# Patient Record
Sex: Male | Born: 1940 | Hispanic: No | Marital: Married | Smoking: Former smoker
Health system: Southern US, Community
[De-identification: ages and names within clinical notes are randomized; demographics above are authoritative.]

## PROBLEM LIST (undated history)

## (undated) DIAGNOSIS — E119 Type 2 diabetes mellitus without complications: Secondary | ICD-10-CM

## (undated) DIAGNOSIS — F0390 Unspecified dementia without behavioral disturbance: Secondary | ICD-10-CM

## (undated) DIAGNOSIS — H9190 Unspecified hearing loss, unspecified ear: Secondary | ICD-10-CM

## (undated) DIAGNOSIS — I10 Essential (primary) hypertension: Secondary | ICD-10-CM

## (undated) HISTORY — PX: ABDOMINAL SURGERY: SHX537

## (undated) HISTORY — DX: Unspecified hearing loss, unspecified ear: H91.90

---

## 2017-07-31 ENCOUNTER — Other Ambulatory Visit: Payer: Self-pay

## 2017-07-31 ENCOUNTER — Encounter (HOSPITAL_COMMUNITY): Payer: Self-pay | Admitting: *Deleted

## 2017-07-31 ENCOUNTER — Emergency Department (HOSPITAL_COMMUNITY)
Admission: EM | Admit: 2017-07-31 | Discharge: 2017-07-31 | Disposition: A | Discharge status: Home / Self Care | Payer: Medicare Other | Attending: Emergency Medicine | Admitting: Emergency Medicine

## 2017-07-31 DIAGNOSIS — R358 Other polyuria: Secondary | ICD-10-CM

## 2017-07-31 DIAGNOSIS — Z760 Encounter for issue of repeat prescription: Secondary | ICD-10-CM

## 2017-07-31 DIAGNOSIS — R631 Polydipsia: Secondary | ICD-10-CM | POA: Insufficient documentation

## 2017-07-31 DIAGNOSIS — E119 Type 2 diabetes mellitus without complications: Secondary | ICD-10-CM | POA: Insufficient documentation

## 2017-07-31 DIAGNOSIS — R3589 Other polyuria: Secondary | ICD-10-CM

## 2017-07-31 DIAGNOSIS — I10 Essential (primary) hypertension: Secondary | ICD-10-CM | POA: Diagnosis not present

## 2017-07-31 HISTORY — DX: Type 2 diabetes mellitus without complications: E11.9

## 2017-07-31 HISTORY — DX: Essential (primary) hypertension: I10

## 2017-07-31 LAB — CBC
HEMATOCRIT: 42.4 % (ref 39.0–52.0)
HEMOGLOBIN: 14.6 g/dL (ref 13.0–17.0)
MCH: 32.3 pg (ref 26.0–34.0)
MCHC: 34.4 g/dL (ref 30.0–36.0)
MCV: 93.8 fL (ref 78.0–100.0)
Platelets: 170 10*3/uL (ref 150–400)
RBC: 4.52 MIL/uL (ref 4.22–5.81)
RDW: 12.3 % (ref 11.5–15.5)
WBC: 7.5 10*3/uL (ref 4.0–10.5)

## 2017-07-31 LAB — URINALYSIS, ROUTINE W REFLEX MICROSCOPIC
Bilirubin Urine: NEGATIVE
HGB URINE DIPSTICK: NEGATIVE
Ketones, ur: NEGATIVE mg/dL
LEUKOCYTES UA: NEGATIVE
NITRITE: NEGATIVE
PH: 5 (ref 5.0–8.0)
Protein, ur: 30 mg/dL — AB
Specific Gravity, Urine: 1.03 (ref 1.005–1.030)

## 2017-07-31 LAB — BASIC METABOLIC PANEL
ANION GAP: 10 (ref 5–15)
BUN: 18 mg/dL (ref 6–20)
CALCIUM: 9.2 mg/dL (ref 8.9–10.3)
CO2: 24 mmol/L (ref 22–32)
Chloride: 102 mmol/L (ref 101–111)
Creatinine, Ser: 1.26 mg/dL — ABNORMAL HIGH (ref 0.61–1.24)
GFR calc Af Amer: >60 mL/min (ref >60)
GFR, EST NON AFRICAN AMERICAN: 53 mL/min — AB (ref >60)
GLUCOSE: 388 mg/dL — AB (ref 65–99)
POTASSIUM: 4.6 mmol/L (ref 3.5–5.1)
SODIUM: 136 mmol/L (ref 135–145)

## 2017-07-31 LAB — CBG MONITORING, ED: Glucose-Capillary: 344 mg/dL — ABNORMAL HIGH (ref 65–99)

## 2017-07-31 MED ORDER — AMLODIPINE BESYLATE 5 MG PO TABS
10.0000 mg | ORAL_TABLET | Freq: Once | ORAL | Status: AC
  Administered 2017-07-31: 10 mg via ORAL
  Filled 2017-07-31: qty 2

## 2017-07-31 MED ORDER — SODIUM CHLORIDE 0.9 % IV BOLUS (SEPSIS)
1000.0000 mL | Freq: Once | INTRAVENOUS | Status: AC
  Administered 2017-07-31: 1000 mL via INTRAVENOUS

## 2017-07-31 MED ORDER — METFORMIN HCL ER 500 MG PO TB24: 1000.0000 mg | ORAL_TABLET | Freq: Every day | ORAL | 1 refills | Status: DC

## 2017-07-31 MED ORDER — IRBESARTAN 300 MG PO TABS
300.0000 mg | ORAL_TABLET | Freq: Every day | ORAL | Status: DC
  Filled 2017-07-31: qty 1

## 2017-07-31 MED ORDER — AMLODIPINE-OLMESARTAN 10-40 MG PO TABS: 1.0000 | ORAL_TABLET | Freq: Every day | ORAL | 1 refills | Status: DC

## 2017-07-31 MED ORDER — METFORMIN HCL ER 500 MG PO TB24: 500.0000 mg | ORAL_TABLET | Freq: Two times a day (BID) | ORAL | 1 refills | Status: DC

## 2017-07-31 NOTE — ED Notes (Signed)
Pt updated on plan of care, pt encouraged to remain for evaluation

## 2017-07-31 NOTE — Discharge Instructions (Addendum)
Call the number on your discharge paperwork to get established with a primary care provider.  I have provided you with a one-month refill of your Azor and metformin.  Take 2 tablets of metformin daily at bedtime.  Take 1 tablet of Azor daily.  If you develop new or worsening symptoms, including a headache, chest pain, difficulty urinating, abdominal pain, or persistent vomiting, please return to the emergency department for re-evaluation.

## 2017-07-31 NOTE — ED Notes (Signed)
Pt aware of hypertension and advised to continue blood pressure meds and to follow up with a PCP.

## 2017-07-31 NOTE — ED Triage Notes (Signed)
PT states he has not been taking his medications for 2-3 months because he is totally out.  Pt is diabetic. Pt states he has had some uneasiness with walking for about one month. Last three weeks patient has noticed increased thirst and urination. Pt states when he fell he hurts self on left lateral abdominal area.  No LOC or syncope.

## 2017-07-31 NOTE — ED Provider Notes (Signed)
[facility] EMERGENCY DEPARTMENT Provider Note   CSN: [id number] Arrival date & time: 07/31/17  1022     History   Chief Complaint Chief Complaint  Patient presents with  . Fall    HPI Dennis Allen is a 77 y.o. male with a history of Dm Type II and HTN who presents to the emergency department with a chief complaint of polyuria and polydipsia that has been worsening over the last month.  He reports that he has been out of his home Metformin and Azor for 2-3 months.  He also reports that he has been eating a lot of candy and drinking a lot of soda for the last few weeks.  He denies fever, chills, abdominal pain, N/V/D, chest pain, dyspnea, headache, visual changes, numbness, or decreased urine.  He also endorses a mechanical fall about 3-4 days ago when he tripped while walking on the sidewalk.  He denies hitting his head, LOC, nausea, or emesis.  He reports no pain or ongoing symptoms related to the fall.  He was previously getting his medications refilled at the [facility] on [address] in [place].  His PCP, Dr. Loel Ro, is located at [facility] family [facility], and he has not seen her in "months". He also reports that he was taking Welchol previously too.   The history is provided by the patient. No language interpreter was used.    Past Medical History:  Diagnosis Date  . Diabetes mellitus without complication ([place])   . Hypertension     There are no active problems to display for this patient.   Past Surgical History:  Procedure Laterality Date  . ABDOMINAL SURGERY         Home Medications    Prior to Admission medications   Medication Sig Start Date End Date Taking? Authorizing Provider  amLODipine-olmesartan (AZOR) 10-40 MG tablet Take 1 tablet by mouth daily. 07/31/17 09/29/17  McDonald, Mia A, PA-C  metFORMIN (GLUCOPHAGE-XR) 500 MG 24 hr tablet Take 2 tablets (1,000 mg total) by mouth at bedtime. 07/31/17 09/29/17  McDonald, Maree Erie A, PA-C    Family  History No family history on file.  Social History Social History   Tobacco Use  . Smoking status: Former Research scientist (life sciences)  . Smokeless tobacco: Never Used  Substance Use Topics  . Alcohol use: Yes    Comment: occ  . Drug use: No     Allergies   Patient has no known allergies.   Review of Systems Review of Systems  Constitutional: Negative for appetite change and fever.  Eyes: Negative for visual disturbance.  Respiratory: Negative for shortness of breath.   Cardiovascular: Negative for chest pain.  Gastrointestinal: Negative for abdominal pain, diarrhea, nausea and vomiting.  Endocrine: Positive for polydipsia and polyuria.  Genitourinary: Negative for dysuria.  Musculoskeletal: Negative for back pain.  Skin: Negative for rash.  Allergic/Immunologic: Negative for immunocompromised state.  Neurological: Negative for numbness and headaches.  Psychiatric/Behavioral: Negative for confusion.     Physical Exam Updated Vital Signs BP (!) 186/106 (BP Location: Left Arm)   Pulse 63   Temp 98.9 F (37.2 C) (Oral)   Resp 18   SpO2 100%   Physical Exam  Constitutional: He appears well-developed.  HENT:  Head: Normocephalic.  Eyes: Conjunctivae are normal. No scleral icterus.  Neck: Neck supple.  Cardiovascular: Normal rate, regular rhythm and intact distal pulses. Exam reveals no gallop and no friction rub.  Murmur heard. Pulmonary/Chest: Effort normal. No stridor. No respiratory distress. He  has no wheezes. He has no rales. He exhibits no tenderness.  Abdominal: Soft. He exhibits no distension and no mass. There is no tenderness. There is no guarding. No hernia.  Neurological: He is alert.  Skin: Skin is warm and dry. Capillary refill takes less than 2 seconds.  Psychiatric: His behavior is normal.  Nursing note and vitals reviewed.    ED Treatments / Results  Labs (all labs ordered are listed, but only abnormal results are displayed) Labs Reviewed  BASIC METABOLIC  PANEL - Abnormal; Notable for the following components:      Result Value   Glucose, Bld 388 (*)    Creatinine, Ser 1.26 (*)    GFR calc non Af Amer 53 (*)    All other components within normal limits  URINALYSIS, ROUTINE W REFLEX MICROSCOPIC - Abnormal; Notable for the following components:   Glucose, UA >=500 (*)    Protein, ur 30 (*)    Bacteria, UA RARE (*)    Squamous Epithelial / LPF 0-5 (*)    All other components within normal limits  CBG MONITORING, ED - Abnormal; Notable for the following components:   Glucose-Capillary 344 (*)    All other components within normal limits  CBC    EKG  EKG Interpretation None       Radiology No results found.  Procedures Procedures (including critical care time)  Medications Ordered in ED Medications  sodium chloride 0.9 % bolus 1,000 mL (0 mLs Intravenous Stopped 07/31/17 1830)  amLODipine (NORVASC) tablet 10 mg (10 mg Oral Given 07/31/17 1756)     Initial Impression / Assessment and Plan / ED Course  I have reviewed the triage vital signs and the nursing notes.  Pertinent labs & imaging results that were available during my care of the patient were reviewed by me and considered in my medical decision making (see chart for details).     77 year old male with a history of Dm Type II and HTN resenting with worsening polydipsia and polyuria for the last month.  The patient was seen and evaluated with Dr. Gilford Raid, attending physician.  He has been out of his home Azor, metformin, and Welchol for the last 2-3 months.  Pharmacy tech called his pharmacy in [place] and was able to find his home medications were Metformin 500 mg BID and amlodipine olmesartan (Azor) 10-40mg . No prescription of Welchol on file.    BP 194/109; vital signs are otherwise unremarkable.  CBG 88. Cr 1.26.  UA with >500 glucosuria and mild proteinuria.  IV fluid bolus given in the ED.  He is or is not available in the ED so he has been given 1 dose of amlodipine  and irbesartan.  On patient recheck, the patient remains asymptomatic.  We will discharge the patient home with a one-month prescription for his home medications with 1 refill and instructions on finding a local PCP.  Strict return precautions given.  He is in no acute distress.  BP with improvement to 186/106 after amlodipine; he remains asymptomatic. The patient is safe for discharge at this time.  Final Clinical Impressions(s) / ED Diagnoses   Final diagnoses:  Polydipsia  Polyuria  Medication refill    ED Discharge Orders        Ordered    amLODipine-olmesartan (AZOR) 10-40 MG tablet  Daily,   Status:  Discontinued     07/31/17 1757    metFORMIN (GLUCOPHAGE-XR) 500 MG 24 hr tablet  2 times daily,   Status:  Discontinued     07/31/17 1757    amLODipine-olmesartan (AZOR) 10-40 MG tablet  Daily     07/31/17 1830    metFORMIN (GLUCOPHAGE-XR) 500 MG 24 hr tablet  Daily at bedtime     07/31/17 1830       McDonald, Laymond Purser, PA-C 07/31/17 Ladene Artist, MD 07/31/17 [phone]

## 2017-09-30 ENCOUNTER — Encounter: Payer: Self-pay | Admitting: Gastroenterology

## 2017-11-12 ENCOUNTER — Ambulatory Visit (INDEPENDENT_AMBULATORY_CARE_PROVIDER_SITE_OTHER): Payer: Medicare Other | Admitting: Gastroenterology

## 2017-11-12 ENCOUNTER — Encounter: Payer: Self-pay | Admitting: Gastroenterology

## 2017-11-12 VITALS — BP 130/80 | HR 86 | Ht 67.0 in | Wt 151.0 lb

## 2017-11-12 DIAGNOSIS — Z1211 Encounter for screening for malignant neoplasm of colon: Secondary | ICD-10-CM | POA: Diagnosis not present

## 2017-11-12 DIAGNOSIS — R54 Age-related physical debility: Secondary | ICD-10-CM | POA: Diagnosis not present

## 2017-11-12 MED ORDER — AMLODIPINE-OLMESARTAN 10-40 MG PO TABS: 1.0000 | ORAL_TABLET | Freq: Every day | ORAL | 0 refills | Status: DC

## 2017-11-12 MED ORDER — SUPREP BOWEL PREP KIT 17.5-3.13-1.6 GM/177ML PO SOLN: ORAL | 0 refills | Status: DC

## 2017-11-12 MED ORDER — METFORMIN HCL ER 500 MG PO TB24: 1000.0000 mg | ORAL_TABLET | Freq: Every day | ORAL | 0 refills | Status: DC

## 2017-11-12 NOTE — Progress Notes (Signed)
HPI :  77 year old male with history of diabetes, hypertension, hearing loss, here for new patient consultation to discuss colon cancer screening.  He states he has live in [place] for several years and moved to [place] in January. He was told by his primary care physician that he needed a colonoscopy. He states his last colonoscopy was about 20 years ago and thinks it was normal. We don't have any records of that exam available today. He denies any troubles with his bowels, no diarrhea or constipation. No blood in his stools. No abdominal pains. He is eating well, no dysphagia, no reflux, no nausea, no vomiting. Overall he is feeling pretty well, he denies any cardiac/pulmonary symptoms. He denies any family history of colon cancer. He is asking for refills of his Azor and metformin today as he is running out of both of them.  He states he is otherwise in good health.     Past Medical History:  Diagnosis Date  . Diabetes mellitus without complication ([place])   . Hard of hearing   . Hypertension      Past Surgical History:  Procedure Laterality Date  . ABDOMINAL SURGERY     Family History  Family history unknown: Yes   Social History   Tobacco Use  . Smoking status: Former Research scientist (life sciences)  . Smokeless tobacco: Never Used  Substance Use Topics  . Alcohol use: Yes    Comment: occ  . Drug use: No   Current Outpatient Medications  Medication Sig Dispense Refill  . amLODipine-olmesartan (AZOR) 10-40 MG tablet Take 1 tablet by mouth daily. 30 tablet 1  . metFORMIN (GLUCOPHAGE-XR) 500 MG 24 hr tablet Take 2 tablets (1,000 mg total) by mouth at bedtime. 60 tablet 1   No current facility-administered medications for this visit.    No Known Allergies   Review of Systems: All systems reviewed and negative except where noted in HPI.   Lab Results  Component Value Date   WBC 7.5 07/31/2017   HGB 14.6 07/31/2017   HCT 42.4 07/31/2017   MCV 93.8 07/31/2017   PLT 170 07/31/2017     Lab Results  Component Value Date   CREATININE 1.26 (H) 07/31/2017   BUN 18 07/31/2017   NA 136 07/31/2017   K 4.6 07/31/2017   CL 102 07/31/2017   CO2 24 07/31/2017    No results found for: ALT, AST, GGT, ALKPHOS, BILITOT   Physical Exam: BP 130/80   Pulse 86   Ht 5\' 7"  (1.702 m)   Wt 151 lb (68.5 kg)   SpO2 98%   BMI 23.65 kg/m  Constitutional: Pleasant,well-developed, male in no acute distress, walks with cane HEENT: Normocephalic and atraumatic. Conjunctivae are normal. No scleral icterus. Neck supple.  Cardiovascular: Normal rate, regular rhythm.  Pulmonary/chest: Effort normal and breath sounds normal. No wheezing, rales or rhonchi. Abdominal: Soft, nondistended, nontender. There are no masses palpable. No hepatomegaly. Extremities: no edema Lymphadenopathy: No cervical adenopathy noted. Neurological: Alert and oriented to person place and time. Skin: Skin is warm and dry. No rashes noted. Psychiatric: Normal mood and affect. Behavior is normal.   ASSESSMENT AND PLAN: 77 year old male here for new patient visit to discuss colon cancer screening in light of his age.  He is overdue for colon cancer screening, he has no alarm symptoms or bowel habit changes, and no history of anemia. I discussed options with him and if he wanted any further colon cancer screening given his age. He is otherwise healthy for  his age with a good life expectancy. I think a colonoscopy at this time is reasonable to clear any polyps to prevent him from developing colon cancer in his 73s. I discussed the risks and benefits of colonoscopy and anesthesia with him, following this discussion he wanted to proceed with a colonoscopy. Further recommendations pending the results. If no significant findings on this exam the likely does not warrant any further colonoscopy exams. Of note I refilled a short supply of Azor and metformin for him, he can follow-up with his primary care for long-term refills of  these.  Saticoy Cellar, MD [facility]

## 2017-11-12 NOTE — Patient Instructions (Addendum)
If you are age 78 or older, your body mass index should be between 23-30. Your Body mass index is 23.65 kg/m. If this is out of the aforementioned range listed, please consider follow up with your Primary Care Provider.  If you are age 76 or younger, your body mass index should be between 19-25. Your Body mass index is 23.65 kg/m. If this is out of the aformentioned range listed, please consider follow up with your Primary Care Provider.   You have been scheduled for a colonoscopy. Please follow written instructions given to you at your visit today.  Please pick up your prep supplies at the pharmacy within the next 1-3 days. If you use inhalers (even only as needed), please bring them with you on the day of your procedure. Your physician has requested that you go to www.startemmi.com and enter the access code given to you at your visit today. This web site gives a general overview about your procedure. However, you should still follow specific instructions given to you by our office regarding your preparation for the procedure.  We have sent a 30 day supply of Metformin and Azor to your pharmacy.  Please see your Primary Care Provider for further refills.  Thank you for entrusting me with your care and for choosing [facility], Dr.  Cellar

## 2017-12-30 ENCOUNTER — Telehealth: Payer: Self-pay | Admitting: Gastroenterology

## 2017-12-30 NOTE — Telephone Encounter (Signed)
Letter faxed to [employer] at [phone]. Called Olivia Mackie and let her know it has been faxed.

## 2017-12-30 NOTE — Telephone Encounter (Signed)
Letter needs to be sent Attn to [employer].  Thank you.

## 2018-01-02 ENCOUNTER — Encounter: Payer: Medicare Other | Admitting: Gastroenterology

## 2018-01-09 ENCOUNTER — Other Ambulatory Visit: Payer: Self-pay

## 2018-01-09 ENCOUNTER — Ambulatory Visit (AMBULATORY_SURGERY_CENTER): Payer: Medicare Other | Admitting: Gastroenterology

## 2018-01-09 ENCOUNTER — Encounter: Payer: Self-pay | Admitting: Gastroenterology

## 2018-01-09 VITALS — BP 130/63 | HR 65 | Temp 99.3°F | Resp 17 | Ht 67.0 in | Wt 151.0 lb

## 2018-01-09 DIAGNOSIS — Z1211 Encounter for screening for malignant neoplasm of colon: Secondary | ICD-10-CM | POA: Diagnosis not present

## 2018-01-09 DIAGNOSIS — D123 Benign neoplasm of transverse colon: Secondary | ICD-10-CM

## 2018-01-09 DIAGNOSIS — K635 Polyp of colon: Secondary | ICD-10-CM

## 2018-01-09 DIAGNOSIS — D120 Benign neoplasm of cecum: Secondary | ICD-10-CM

## 2018-01-09 DIAGNOSIS — D124 Benign neoplasm of descending colon: Secondary | ICD-10-CM

## 2018-01-09 MED ORDER — SODIUM CHLORIDE 0.9 % IV SOLN: 500.0000 mL | Freq: Once | INTRAVENOUS | Status: AC

## 2018-01-09 NOTE — Progress Notes (Signed)
A and O x3. Report to RN. Tolerated MAC anesthesia well.

## 2018-01-09 NOTE — Patient Instructions (Signed)
Impression/Recommendations:  Polyp handout given to patient. Diverticulosis handout given to patient. Hemorrhoid handout given to patient.  Resume previous diet. Continue present medications.  Await pathology results.  YOU HAD AN ENDOSCOPIC PROCEDURE TODAY AT THE [facility]:   Refer to the procedure report that was given to you for any specific questions about what was found during the examination.  If the procedure report does not answer your questions, please call your gastroenterologist to clarify.  If you requested that your care partner not be given the details of your procedure findings, then the procedure report has been included in a sealed envelope for you to review at your convenience later.  YOU SHOULD EXPECT: Some feelings of bloating in the abdomen. Passage of more gas than usual.  Walking can help get rid of the air that was put into your GI tract during the procedure and reduce the bloating. If you had a lower endoscopy (such as a colonoscopy or flexible sigmoidoscopy) you may notice spotting of blood in your stool or on the toilet paper. If you underwent a bowel prep for your procedure, you may not have a normal bowel movement for a few days.  Please Note:  You might notice some irritation and congestion in your nose or some drainage.  This is from the oxygen used during your procedure.  There is no need for concern and it should clear up in a day or so.  SYMPTOMS TO REPORT IMMEDIATELY:   Following lower endoscopy (colonoscopy or flexible sigmoidoscopy):  Excessive amounts of blood in the stool  Significant tenderness or worsening of abdominal pains  Swelling of the abdomen that is new, acute  Fever of 100F or higher For urgent or emergent issues, a gastroenterologist can be reached at any hour by calling [phone].   DIET:  We do recommend a small meal at first, but then you may proceed to your regular diet.  Drink plenty of fluids but you should avoid  alcoholic beverages for 24 hours.  ACTIVITY:  You should plan to take it easy for the rest of today and you should NOT DRIVE or use heavy machinery until tomorrow (because of the sedation medicines used during the test).    FOLLOW UP: Our staff will call the number listed on your records the next business day following your procedure to check on you and address any questions or concerns that you may have regarding the information given to you following your procedure. If we do not reach you, we will leave a message.  However, if you are feeling well and you are not experiencing any problems, there is no need to return our call.  We will assume that you have returned to your regular daily activities without incident.  If any biopsies were taken you will be contacted by phone or by letter within the next 1-3 weeks.  Please call us at [phone] if you have not heard about the biopsies in 3 weeks.    SIGNATURES/CONFIDENTIALITY: You and/or your care partner have signed paperwork which will be entered into your electronic medical record.  These signatures attest to the fact that that the information above on your After Visit Summary has been reviewed and is understood.  Full responsibility of the confidentiality of this discharge information lies with you and/or your care-partner. 

## 2018-01-09 NOTE — Progress Notes (Signed)
Called to room to assist during endoscopic procedure.  Patient ID and intended procedure confirmed with present staff. Received instructions for my participation in the procedure from the performing physician.  

## 2018-01-10 NOTE — Op Note (Signed)
[place] Patient Name: Dennis Allen Procedure Date: 01/09/2018 2:53 PM MRN: [id number] Endoscopist: Remo Lipps P. Havery Moros , MD Age: 77 Referring MD:  Date of Birth: September 13, 1940 Gender: Male Account #: [id number] Procedure:                Colonoscopy Indications:              Screening for colorectal malignant neoplasm Medicines:                Monitored Anesthesia Care Procedure:                Pre-Anesthesia Assessment:                           - Prior to the procedure, a History and Physical                            was performed, and patient medications and                            allergies were reviewed. The patient's tolerance of                            previous anesthesia was also reviewed. The risks                            and benefits of the procedure and the sedation                            options and risks were discussed with the patient.                            All questions were answered, and informed consent                            was obtained. Prior Anticoagulants: The patient has                            taken no previous anticoagulant or antiplatelet                            agents. ASA Grade Assessment: III - A patient with                            severe systemic disease. After reviewing the risks                            and benefits, the patient was deemed in                            satisfactory condition to undergo the procedure.                           After obtaining informed consent, the colonoscope  was passed under direct vision. Throughout the                            procedure, the patient's blood pressure, pulse, and                            oxygen saturations were monitored continuously. The                            Colonoscope was introduced through the anus and                            advanced to the the cecum, identified by                            appendiceal  orifice and ileocecal valve. The                            colonoscopy was performed without difficulty. The                            patient tolerated the procedure well. The quality                            of the bowel preparation was adequate. The                            ileocecal valve, appendiceal orifice, and rectum                            were photographed. Scope In: 3:01:43 PM Scope Out: 3:30:51 PM Scope Withdrawal Time: 0 hours 19 minutes 46 seconds  Total Procedure Duration: 0 hours 29 minutes 8 seconds  Findings:                 The perianal and digital rectal examinations were                            normal.                           A 3 mm polyp was found in the cecum. The polyp was                            sessile. The polyp was removed with a cold snare.                            Resection and retrieval were complete.                           A 10 mm polyp was found in the transverse colon.                            The polyp was sessile. The polyp was removed with a  cold snare. Resection and retrieval were complete.                           A 12 mm polyp was found in the descending colon.                            The polyp was pedunculated and hyperplastic /                            prolapse type polyp in appearance. The polyp was                            removed with a hot snare. Resection and retrieval                            were complete.                           Scattered medium-mouthed diverticula were found in                            the transverse colon and left colon.                           Internal hemorrhoids were found during retroflexion.                           The colon was tortous. The prep in the right colon                            was fair requiring lavage. Adequate views seen in                            the area with exception of AO which was difficult                            to  clear due to residual seeds / nuts, but no                            obvious lesions noted with washing. The exam was                            otherwise without abnormality. Complications:            No immediate complications. Estimated blood loss:                            Minimal. Estimated Blood Loss:     Estimated blood loss was minimal. Impression:               - One 3 mm polyp in the cecum, removed with a cold                            snare. Resected and retrieved.                           -  One 10 mm polyp in the transverse colon, removed                            with a cold snare. Resected and retrieved.                           - One 12 mm polyp in the descending colon, removed                            with a hot snare. Resected and retrieved.                           - Diverticulosis in the transverse colon and in the                            left colon.                           - Internal hemorrhoids.                           - Tortous colon                           - The examination was otherwise normal. Recommendation:           - Patient has a contact number available for                            emergencies. The signs and symptoms of potential                            delayed complications were discussed with the                            patient. Return to normal activities tomorrow.                            Written discharge instructions were provided to the                            patient.                           - Resume previous diet.                           - Continue present medications.                           - Await pathology results. Remo Lipps P. Winslow Verrill, MD 01/09/2018 3:35:46 PM This report has been signed electronically.

## 2018-01-13 ENCOUNTER — Telehealth: Payer: Self-pay

## 2018-01-13 NOTE — Telephone Encounter (Signed)
  Follow up Call-  Call back number 01/09/2018  Post procedure Call Back phone  # [phone]  Permission to leave phone message Yes     Patient questions:  Do you have a fever, pain , or abdominal swelling? No. Pain Score  0 *  Have you tolerated food without any problems? Yes.    Have you been able to return to your normal activities? Yes.    Do you have any questions about your discharge instructions: Diet   No. Medications  No. Follow up visit  No.  Do you have questions or concerns about your Care? No.  Actions: * If pain score is 4 or above: No action needed, pain <4.

## 2018-01-13 NOTE — Telephone Encounter (Signed)
Invalid phone number.

## 2018-01-15 ENCOUNTER — Encounter: Payer: Self-pay | Admitting: Gastroenterology

## 2018-01-17 ENCOUNTER — Encounter (HOSPITAL_COMMUNITY): Payer: Self-pay

## 2018-01-17 ENCOUNTER — Emergency Department (HOSPITAL_COMMUNITY)
Admission: EM | Admit: 2018-01-17 | Discharge: 2018-01-17 | Disposition: A | Discharge status: Home / Self Care | Payer: Medicare Other | Attending: Emergency Medicine | Admitting: Emergency Medicine

## 2018-01-17 ENCOUNTER — Emergency Department (HOSPITAL_COMMUNITY): Payer: Medicare Other

## 2018-01-17 DIAGNOSIS — Z7984 Long term (current) use of oral hypoglycemic drugs: Secondary | ICD-10-CM | POA: Insufficient documentation

## 2018-01-17 DIAGNOSIS — Y999 Unspecified external cause status: Secondary | ICD-10-CM | POA: Insufficient documentation

## 2018-01-17 DIAGNOSIS — E119 Type 2 diabetes mellitus without complications: Secondary | ICD-10-CM | POA: Diagnosis not present

## 2018-01-17 DIAGNOSIS — I10 Essential (primary) hypertension: Secondary | ICD-10-CM | POA: Diagnosis not present

## 2018-01-17 DIAGNOSIS — W109XXA Fall (on) (from) unspecified stairs and steps, initial encounter: Secondary | ICD-10-CM | POA: Insufficient documentation

## 2018-01-17 DIAGNOSIS — Y9301 Activity, walking, marching and hiking: Secondary | ICD-10-CM | POA: Insufficient documentation

## 2018-01-17 DIAGNOSIS — M25511 Pain in right shoulder: Secondary | ICD-10-CM

## 2018-01-17 DIAGNOSIS — Z87891 Personal history of nicotine dependence: Secondary | ICD-10-CM | POA: Diagnosis not present

## 2018-01-17 DIAGNOSIS — S42291A Other displaced fracture of upper end of right humerus, initial encounter for closed fracture: Secondary | ICD-10-CM | POA: Diagnosis not present

## 2018-01-17 DIAGNOSIS — Y92240 Courthouse as the place of occurrence of the external cause: Secondary | ICD-10-CM | POA: Diagnosis not present

## 2018-01-17 DIAGNOSIS — Z79899 Other long term (current) drug therapy: Secondary | ICD-10-CM | POA: Insufficient documentation

## 2018-01-17 DIAGNOSIS — S4991XA Unspecified injury of right shoulder and upper arm, initial encounter: Secondary | ICD-10-CM | POA: Diagnosis present

## 2018-01-17 MED ORDER — FENTANYL CITRATE (PF) 100 MCG/2ML IJ SOLN
50.0000 ug | Freq: Once | INTRAMUSCULAR | Status: AC
  Administered 2018-01-17: 50 ug via INTRAVENOUS
  Filled 2018-01-17: qty 2

## 2018-01-17 MED ORDER — OXYCODONE-ACETAMINOPHEN 5-325 MG PO TABS: 1.0000 | ORAL_TABLET | ORAL | 0 refills | Status: AC | PRN

## 2018-01-17 NOTE — Discharge Instructions (Addendum)
I have prescribed pain medication, please half a tablet for pain as needed for severe pain. Do not drink or drive while taking this medication as it can make you drowsy. Please schedule appointment with Dr. Lorin Mercy for Tuesday 7/31. Please return to the ED if you experience any of the following symptoms:  Your skin or fingers on your injured arm turn blue or gray. Your arm feels cold or numb. You have severe pain in your injured arm.

## 2018-01-17 NOTE — ED Triage Notes (Signed)
[place], reports right arm and shoulder pain.

## 2018-01-17 NOTE — ED Notes (Signed)
X-ray at patient bedside

## 2018-01-17 NOTE — ED Provider Notes (Addendum)
[facility] DEPT Provider Note   CSN: [id number] Arrival date & time: 01/17/18  1034     History   Chief Complaint Chief Complaint  Patient presents with  . Arm Pain  . Shoulder Pain    HPI Dennis Allen is a 77 y.o. male.  77 y/o with medical history of hypertension and DM presents to the ED [place].  She states he was walking up the stairs when he lost his balance and went ahead and fell onto his right shoulder. Patient is currently complaining of severe pain in his right elbow and right shoulder. Patient denies hitting his head, he is not currently on any blood thinners. Patient states the pain is worse with movement but better when arm is resting on his chest. He denies any LOC, Head trauma, chest pain or shortness of breath.      Past Medical History:  Diagnosis Date  . Diabetes mellitus without complication ([place])   . Hard of hearing   . Hypertension     There are no active problems to display for this patient.   Past Surgical History:  Procedure Laterality Date  . ABDOMINAL SURGERY          Home Medications    Prior to Admission medications   Medication Sig Start Date End Date Taking? Authorizing Provider  amLODipine-olmesartan (AZOR) 10-40 MG tablet Take 1 tablet by mouth daily. 11/12/17 01/17/18 Yes Armbruster, Carlota Raspberry, MD  hydrocortisone 2.5 % cream Apply 2.5 application topically every morning. 01/02/18 01/02/19 Yes [provider]  metFORMIN (GLUCOPHAGE-XR) 500 MG 24 hr tablet Take 2 tablets (1,000 mg total) by mouth at bedtime. 11/12/17 01/17/18 Yes Armbruster, Carlota Raspberry, MD  amLODipine-olmesartan (AZOR) 10-40 MG tablet Take 1 tablet by mouth daily.    [provider]  glipiZIDE (GLUCOTROL) 5 MG tablet Take 1 tablet (5 mg total) by mouth daily before breakfast. 01/19/18   [name], MD  metFORMIN (GLUCOPHAGE-XR) 500 MG 24 hr tablet Take 2 tablets (1,000 mg total) by mouth 2  (two) times daily. 01/19/18   [name], MD    Family History Family History  Family history unknown: Yes    Social History Social History   Tobacco Use  . Smoking status: Former Research scientist (life sciences)  . Smokeless tobacco: Never Used  Substance Use Topics  . Alcohol use: Yes    Comment: occ  . Drug use: No     Allergies   Patient has no known allergies.   Review of Systems Review of Systems   Physical Exam Updated Vital Signs BP (!) 141/83   Pulse 87   Temp 97.9 F (36.6 C) (Oral)   SpO2 100%   Physical Exam  Constitutional: He is oriented to person, place, and time. He appears well-developed and well-nourished.  HENT:  Head: Normocephalic and atraumatic.  Neck: Normal range of motion. Neck supple.  Cardiovascular: Normal heart sounds.  Pulmonary/Chest: Effort normal and breath sounds normal. He has no wheezes.  Abdominal: Soft. Bowel sounds are normal.  Musculoskeletal: He exhibits tenderness. He exhibits no deformity.       Right shoulder: He exhibits pain.       Right elbow: He exhibits no swelling and no laceration. Tenderness found.  Unable to move right shoulder without pain. Pulses present. Patient with soft cloth shoulder sling lying in bed.   Neurological: He is alert and oriented to person, place, and time.  Skin: Skin is warm and dry.  No rash noted.  Nursing note and vitals reviewed.    ED Treatments / Results  Labs (all labs ordered are listed, but only abnormal results are displayed) Labs Reviewed - No data to display  EKG None  Radiology No results found.  Procedures Procedures (including critical care time)  Medications Ordered in ED Medications  fentaNYL (SUBLIMAZE) injection 50 mcg (50 mcg Intravenous Given 01/17/18 1235)     Initial Impression / Assessment and Plan / ED Course  I have reviewed the triage vital signs and the nursing notes.  Pertinent labs & imaging results that were available during my care of the patient were reviewed  by me and considered in my medical decision making (see chart for details).    Patient's xray showed a fracture of the proximal right humerus,midly displaced.Humeral head is normally aligned with the glenoid. Patient was given medication for pain. I spoke to Orthopedics on call Dr. Lorin Mercy, he recommended patient be placed in right shoulder immobilizer and follow up with him outpatient basis. Patient will be discharge home with pain medication and ortho follow up Tuesday.Dr. Ralene Bathe has seen this patient and agrees with my plan and management. Return precautions thoroughly discussed with patient.   Final Clinical Impressions(s) / ED Diagnoses   Final diagnoses:  Other closed displaced fracture of proximal end of right humerus, initial encounter  Acute pain of right shoulder    ED Discharge Orders         Ordered    oxyCODONE-acetaminophen (PERCOCET/ROXICET) 5-325 MG tablet  As needed     01/17/18 1343           Janeece Fitting, PA-C 01/17/18 1349    Quintella Reichert, MD 01/22/18 1558    Janeece Fitting, PA-C 03/14/18 2138    Quintella Reichert, MD 03/15/18 [phone]

## 2018-01-19 ENCOUNTER — Emergency Department (HOSPITAL_COMMUNITY)
Admission: EM | Admit: 2018-01-19 | Discharge: 2018-01-20 | Disposition: A | Discharge status: Home / Self Care | Payer: Medicare Other | Attending: Emergency Medicine | Admitting: Emergency Medicine

## 2018-01-19 ENCOUNTER — Other Ambulatory Visit: Payer: Self-pay

## 2018-01-19 ENCOUNTER — Encounter (HOSPITAL_COMMUNITY): Payer: Self-pay

## 2018-01-19 DIAGNOSIS — E860 Dehydration: Secondary | ICD-10-CM

## 2018-01-19 DIAGNOSIS — Z7984 Long term (current) use of oral hypoglycemic drugs: Secondary | ICD-10-CM | POA: Diagnosis not present

## 2018-01-19 DIAGNOSIS — I10 Essential (primary) hypertension: Secondary | ICD-10-CM | POA: Insufficient documentation

## 2018-01-19 DIAGNOSIS — R739 Hyperglycemia, unspecified: Secondary | ICD-10-CM

## 2018-01-19 DIAGNOSIS — E1165 Type 2 diabetes mellitus with hyperglycemia: Secondary | ICD-10-CM | POA: Diagnosis not present

## 2018-01-19 DIAGNOSIS — Z79899 Other long term (current) drug therapy: Secondary | ICD-10-CM | POA: Diagnosis not present

## 2018-01-19 DIAGNOSIS — Z87891 Personal history of nicotine dependence: Secondary | ICD-10-CM | POA: Insufficient documentation

## 2018-01-19 DIAGNOSIS — S42201D Unspecified fracture of upper end of right humerus, subsequent encounter for fracture with routine healing: Secondary | ICD-10-CM | POA: Diagnosis not present

## 2018-01-19 DIAGNOSIS — W19XXXD Unspecified fall, subsequent encounter: Secondary | ICD-10-CM | POA: Diagnosis not present

## 2018-01-19 DIAGNOSIS — N179 Acute kidney failure, unspecified: Secondary | ICD-10-CM | POA: Insufficient documentation

## 2018-01-19 LAB — BASIC METABOLIC PANEL
ANION GAP: 9 (ref 5–15)
BUN: 41 mg/dL — ABNORMAL HIGH (ref 8–23)
CALCIUM: 8.9 mg/dL (ref 8.9–10.3)
CHLORIDE: 99 mmol/L (ref 98–111)
CO2: 27 mmol/L (ref 22–32)
CREATININE: 1.74 mg/dL — AB (ref 0.61–1.24)
GFR calc non Af Amer: 36 mL/min — ABNORMAL LOW (ref >60)
GFR, EST AFRICAN AMERICAN: 41 mL/min — AB (ref >60)
GLUCOSE: 583 mg/dL — AB (ref 70–99)
Potassium: 4.8 mmol/L (ref 3.5–5.1)
Sodium: 135 mmol/L (ref 135–145)

## 2018-01-19 LAB — CBC
HCT: 34.7 % — ABNORMAL LOW (ref 39.0–52.0)
HEMOGLOBIN: 12.2 g/dL — AB (ref 13.0–17.0)
MCH: 32.8 pg (ref 26.0–34.0)
MCHC: 35.2 g/dL (ref 30.0–36.0)
MCV: 93.3 fL (ref 78.0–100.0)
Platelets: 157 10*3/uL (ref 150–400)
RBC: 3.72 MIL/uL — AB (ref 4.22–5.81)
RDW: 12.4 % (ref 11.5–15.5)
WBC: 9.1 10*3/uL (ref 4.0–10.5)

## 2018-01-19 LAB — CBG MONITORING, ED
GLUCOSE-CAPILLARY: 465 mg/dL — AB (ref 70–99)
Glucose-Capillary: 293 mg/dL — ABNORMAL HIGH (ref 70–99)

## 2018-01-19 MED ORDER — GLIPIZIDE 5 MG PO TABS: 5.0000 mg | ORAL_TABLET | Freq: Every day | ORAL | 0 refills | Status: DC

## 2018-01-19 MED ORDER — SODIUM CHLORIDE 0.9 % IV BOLUS
1000.0000 mL | Freq: Once | INTRAVENOUS | Status: AC
  Administered 2018-01-19: 1000 mL via INTRAVENOUS

## 2018-01-19 MED ORDER — INSULIN ASPART 100 UNIT/ML ~~LOC~~ SOLN
16.0000 [IU] | Freq: Once | SUBCUTANEOUS | Status: AC
  Administered 2018-01-19: 12 [IU] via SUBCUTANEOUS
  Filled 2018-01-19 (×2): qty 1

## 2018-01-19 MED ORDER — METFORMIN HCL ER 500 MG PO TB24: 1000.0000 mg | ORAL_TABLET | Freq: Two times a day (BID) | ORAL | 0 refills | Status: DC

## 2018-01-19 NOTE — ED Provider Notes (Addendum)
[facility] DEPT Provider Note   CSN: [id number] Arrival date & time: 01/19/18  1949     History   Chief Complaint No chief complaint on file.   HPI Dennis Allen is a 77 y.o. male.  Patient c/o recent mechanical fall with proximal right humerus fx 2 days ago. Pain to area is constant, persistent, moderate, worse w movement, non radiating. States he lost the sling and called ems to get a new sling. Ems noted blood sugar read high on their meter, > 600. Pt notes long-standing hx dm, but states only takes metformin. Denies fever or chills. Does not feel sick or ill. No vomiting. States compliant w home meds.  The history is provided by the patient and the EMS personnel.    Past Medical History:  Diagnosis Date  . Diabetes mellitus without complication ([place])   . Hard of hearing   . Hypertension     There are no active problems to display for this patient.   Past Surgical History:  Procedure Laterality Date  . ABDOMINAL SURGERY          Home Medications    Prior to Admission medications   Medication Sig Start Date End Date Taking? Authorizing Provider  amLODipine-olmesartan (AZOR) 10-40 MG tablet Take 1 tablet by mouth daily. 11/12/17 01/17/18  Armbruster, Carlota Raspberry, MD  glimepiride (AMARYL) 4 MG tablet Take 4 mg by mouth daily. 11/24/15   [provider]  hydrocortisone 2.5 % cream Apply 2.5 application topically every morning. 01/02/18 01/02/19  [provider]  metFORMIN (GLUCOPHAGE-XR) 500 MG 24 hr tablet Take 2 tablets (1,000 mg total) by mouth at bedtime. 11/12/17 01/17/18  Armbruster, Carlota Raspberry, MD  oxyCODONE-acetaminophen (PERCOCET/ROXICET) 5-325 MG tablet Take 1 tablet by mouth as needed for up to 5 days for severe pain. 01/17/18 01/22/18  Janeece Fitting, PA-C  rosuvastatin (CRESTOR) 20 MG tablet Take 20 mg by mouth daily. 12/25/17   [provider]    Family History Family History  Family history unknown: Yes     Social History Social History   Tobacco Use  . Smoking status: Former Research scientist (life sciences)  . Smokeless tobacco: Never Used  Substance Use Topics  . Alcohol use: Yes    Comment: occ  . Drug use: No     Allergies   Patient has no known allergies.   Review of Systems Review of Systems  Constitutional: Negative for chills and fever.  HENT: Negative for sore throat.   Eyes: Negative for redness.  Respiratory: Negative for shortness of breath.   Cardiovascular: Negative for chest pain.  Gastrointestinal: Negative for abdominal pain, diarrhea and vomiting.  Endocrine: Negative for polydipsia.  Genitourinary: Negative for flank pain.  Musculoskeletal: Negative for back pain and neck pain.  Skin: Negative for rash.  Neurological: Negative for headaches.  Hematological: Does not bruise/bleed easily.  Psychiatric/Behavioral: Negative for confusion.     Physical Exam Updated Vital Signs There were no vitals taken for this visit.  Physical Exam  Constitutional: He is oriented to person, place, and time. He appears well-developed and well-nourished.  HENT:  Head: Atraumatic.  Mouth/Throat: Oropharynx is clear and moist.  Eyes: Conjunctivae are normal.  Neck: Normal range of motion. Neck supple. No tracheal deviation present.  Cardiovascular: Normal rate, regular rhythm, normal heart sounds and intact distal pulses.  Pulmonary/Chest: Effort normal and breath sounds normal. No accessory muscle usage. No respiratory distress. He exhibits no tenderness.  Abdominal: Soft. Bowel sounds are normal. He exhibits  no distension. There is no tenderness.  Genitourinary:  Genitourinary Comments: No cva tenderness.   Musculoskeletal: He exhibits no edema.  CTLS spine, non tender, aligned, no step off. Mild sts/tenderness right shoulder. Radial pulse 2+.   Neurological: He is alert and oriented to person, place, and time.  Skin: Skin is warm and dry. No rash noted.  Psychiatric: He has a normal  mood and affect.  Nursing note and vitals reviewed.    ED Treatments / Results  Labs (all labs ordered are listed, but only abnormal results are displayed) Results for orders placed or performed during the hospital encounter of 50/53/97  Basic metabolic panel  Result Value Ref Range   Sodium 135 135 - 145 mmol/L   Potassium 4.8 3.5 - 5.1 mmol/L   Chloride 99 98 - 111 mmol/L   CO2 27 22 - 32 mmol/L   Glucose, Bld 583 (HH) 70 - 99 mg/dL   BUN 41 (H) 8 - 23 mg/dL   Creatinine, Ser 1.74 (H) 0.61 - 1.24 mg/dL   Calcium 8.9 8.9 - 10.3 mg/dL   GFR calc non Af Amer 36 (L) >60 mL/min   GFR calc Af Amer 41 (L) >60 mL/min   Anion gap 9 5 - 15  CBC  Result Value Ref Range   WBC 9.1 4.0 - 10.5 K/uL   RBC 3.72 (L) 4.22 - 5.81 MIL/uL   Hemoglobin 12.2 (L) 13.0 - 17.0 g/dL   HCT 34.7 (L) 39.0 - 52.0 %   MCV 93.3 78.0 - 100.0 fL   MCH 32.8 26.0 - 34.0 pg   MCHC 35.2 30.0 - 36.0 g/dL   RDW 12.4 11.5 - 15.5 %   Platelets 157 150 - 400 K/uL  POC CBG, ED  Result Value Ref Range   Glucose-Capillary 465 (H) 70 - 99 mg/dL   Dg Shoulder Right  Result Date: 01/17/2018 CLINICAL DATA:  Fall.  Right arm and shoulder pain. EXAM: RIGHT SHOULDER - 2+ VIEW COMPARISON:  None. FINDINGS: There is a displaced fracture of the proximal humerus, across the metaphysis. Shaft fracture component has displaced superiorly by 2.5 cm. Humeral head remains normally aligned with the glenoid. No other fractures.  AC joint is normally aligned. Bones are demineralized. IMPRESSION: 1. Displaced fracture of the proximal right humerus as described. Electronically Signed   By: Lajean Manes M.D.   On: 01/17/2018 12:36   Dg Humerus Right  Result Date: 01/17/2018 CLINICAL DATA:  Fall.  Right arm and shoulder pain. EXAM: RIGHT HUMERUS - 2+ VIEW COMPARISON:  None. FINDINGS: Transverse fractures noted across the proximal humeral metaphysis, with least 1.5 cm of superior displacement of the shaft component. No other fractures. No bone  lesions. Glenohumeral and elbow joints are normally aligned. IMPRESSION: Fracture of the proximal right humerus, mildly displaced, as described. No dislocation. Electronically Signed   By: Lajean Manes M.D.   On: 01/17/2018 12:37    EKG None  Radiology No results found.  Procedures Procedures (including critical care time)  Medications Ordered in ED Medications  sodium chloride 0.9 % bolus 1,000 mL (has no administration in time range)     Initial Impression / Assessment and Plan / ED Course  I have reviewed the triage vital signs and the nursing notes.  Pertinent labs & imaging results that were available during my care of the patient were reviewed by me and considered in my medical decision making (see chart for details).  Labs sent.   New shoulder sling ordered.  Reviewed nursing notes and prior charts for additional history.   Iv ns bolus.   Prior to insulin being given, glucose mildly improved w fluids - dose change to novolog 12 units, nurse informed.   2nd liter ns.   Po fluids/snack.   No vomiting. Pt states feels fine, requests d/c  Pt states he has more than enough of his metformin at home.  As glucose markedly high, will increased metformin from 1000 mg q day to bid, and will add glipizide.   pcp is Dr Melford Aase - will have f/u there in 1-2 days.   Pt requests rx for walker - provided.   Pt continued to state feels well and requests d/c.   Signed out to Dr Florina Ou at 2318 to repeat sugars and make sure is improved, and that pt stable for d/c.     Final Clinical Impressions(s) / ED Diagnoses   Final diagnoses:  None    ED Discharge Orders    None           [name], MD 01/19/18 2320

## 2018-01-19 NOTE — ED Triage Notes (Signed)
Per PT[facility]: Pt coming from home reports that he fractured his right arm Friday, was seen and treated, and given a sling. Reports right arm came out of sling today and c/o of pain. PTAR reports that on the way to hospital, CBG read high.   Hx of diabetes, pt takes metformin

## 2018-01-19 NOTE — ED Notes (Addendum)
Date and time results received: 01/19/18 2139   Test: glucose Critical Value: 583  Name of Provider Notified: Anette Guarneri

## 2018-01-19 NOTE — Discharge Instructions (Signed)
It was our pleasure to provide your ER care today - we hope that you feel better.  Increase your metformin to 1,000 mg 2x/day. Also take glipizide as prescribed.  Check sugars 4x/day and record values.  From today's labs, your blood sugar was markedly high (580) and kidney function elevated (creatinine 1.7) - follow up with your doctor in the next 1-2 days - call office tomorrow AM to arrange appointment.    Also follow up with orthopedist as previously instructed.   Return to ER if worse, new symptoms, new or severe pain, fevers, weak/fainting, other concern.

## 2018-01-20 ENCOUNTER — Ambulatory Visit (INDEPENDENT_AMBULATORY_CARE_PROVIDER_SITE_OTHER): Payer: Self-pay | Admitting: Orthopaedic Surgery

## 2018-01-20 ENCOUNTER — Telehealth (INDEPENDENT_AMBULATORY_CARE_PROVIDER_SITE_OTHER): Payer: Self-pay

## 2018-01-20 NOTE — Telephone Encounter (Signed)
Talked with patient concerning appointment.  Patient has appt.to see Dr. Lorin Mercy, 01/20/2018.

## 2020-07-10 ENCOUNTER — Emergency Department (HOSPITAL_COMMUNITY): Payer: Medicare Other

## 2020-07-10 ENCOUNTER — Other Ambulatory Visit: Payer: Self-pay

## 2020-07-10 ENCOUNTER — Emergency Department (HOSPITAL_COMMUNITY)
Admission: EM | Admit: 2020-07-10 | Discharge: 2020-07-10 | Disposition: A | Discharge status: Home / Self Care | Payer: Medicare Other | Attending: Emergency Medicine | Admitting: Emergency Medicine

## 2020-07-10 DIAGNOSIS — Y92129 Unspecified place in nursing home as the place of occurrence of the external cause: Secondary | ICD-10-CM | POA: Diagnosis not present

## 2020-07-10 DIAGNOSIS — S22029A Unspecified fracture of second thoracic vertebra, initial encounter for closed fracture: Secondary | ICD-10-CM | POA: Diagnosis not present

## 2020-07-10 DIAGNOSIS — W19XXXA Unspecified fall, initial encounter: Secondary | ICD-10-CM

## 2020-07-10 DIAGNOSIS — I10 Essential (primary) hypertension: Secondary | ICD-10-CM | POA: Insufficient documentation

## 2020-07-10 DIAGNOSIS — F329 Major depressive disorder, single episode, unspecified: Secondary | ICD-10-CM | POA: Diagnosis not present

## 2020-07-10 DIAGNOSIS — Z87891 Personal history of nicotine dependence: Secondary | ICD-10-CM | POA: Diagnosis not present

## 2020-07-10 DIAGNOSIS — Y9301 Activity, walking, marching and hiking: Secondary | ICD-10-CM | POA: Diagnosis not present

## 2020-07-10 DIAGNOSIS — Z7984 Long term (current) use of oral hypoglycemic drugs: Secondary | ICD-10-CM | POA: Insufficient documentation

## 2020-07-10 DIAGNOSIS — S065X9A Traumatic subdural hemorrhage with loss of consciousness of unspecified duration, initial encounter: Secondary | ICD-10-CM

## 2020-07-10 DIAGNOSIS — S065X0A Traumatic subdural hemorrhage without loss of consciousness, initial encounter: Secondary | ICD-10-CM | POA: Diagnosis not present

## 2020-07-10 DIAGNOSIS — Z79899 Other long term (current) drug therapy: Secondary | ICD-10-CM | POA: Diagnosis not present

## 2020-07-10 DIAGNOSIS — E119 Type 2 diabetes mellitus without complications: Secondary | ICD-10-CM | POA: Insufficient documentation

## 2020-07-10 DIAGNOSIS — W010XXA Fall on same level from slipping, tripping and stumbling without subsequent striking against object, initial encounter: Secondary | ICD-10-CM | POA: Insufficient documentation

## 2020-07-10 DIAGNOSIS — S22039A Unspecified fracture of third thoracic vertebra, initial encounter for closed fracture: Secondary | ICD-10-CM | POA: Diagnosis not present

## 2020-07-10 DIAGNOSIS — S0101XA Laceration without foreign body of scalp, initial encounter: Secondary | ICD-10-CM | POA: Diagnosis present

## 2020-07-10 DIAGNOSIS — S065XAA Traumatic subdural hemorrhage with loss of consciousness status unknown, initial encounter: Secondary | ICD-10-CM

## 2020-07-10 LAB — CBC WITH DIFFERENTIAL/PLATELET
Abs Immature Granulocytes: 0.02 10*3/uL (ref 0.00–0.07)
Basophils Absolute: 0 10*3/uL (ref 0.0–0.1)
Basophils Relative: 0 %
Eosinophils Absolute: 0 10*3/uL (ref 0.0–0.5)
Eosinophils Relative: 0 %
HCT: 27.4 % — ABNORMAL LOW (ref 39.0–52.0)
Hemoglobin: 9.7 g/dL — ABNORMAL LOW (ref 13.0–17.0)
Immature Granulocytes: 0 %
Lymphocytes Relative: 7 %
Lymphs Abs: 0.7 10*3/uL (ref 0.7–4.0)
MCH: 42.7 pg — ABNORMAL HIGH (ref 26.0–34.0)
MCHC: 35.4 g/dL (ref 30.0–36.0)
MCV: 120.7 fL — ABNORMAL HIGH (ref 80.0–100.0)
Monocytes Absolute: 0.2 10*3/uL (ref 0.1–1.0)
Monocytes Relative: 2 %
Neutro Abs: 9.2 10*3/uL — ABNORMAL HIGH (ref 1.7–7.7)
Neutrophils Relative %: 91 %
Platelets: 163 10*3/uL (ref 150–400)
RBC: 2.27 MIL/uL — ABNORMAL LOW (ref 4.22–5.81)
RDW: 12.1 % (ref 11.5–15.5)
WBC: 10.2 10*3/uL (ref 4.0–10.5)
nRBC: 0 % (ref 0.0–0.2)

## 2020-07-10 LAB — BASIC METABOLIC PANEL
Anion gap: 5 (ref 5–15)
BUN: 24 mg/dL — ABNORMAL HIGH (ref 8–23)
CO2: 33 mmol/L — ABNORMAL HIGH (ref 22–32)
Calcium: 9.3 mg/dL (ref 8.9–10.3)
Chloride: 102 mmol/L (ref 98–111)
Creatinine, Ser: 1.28 mg/dL — ABNORMAL HIGH (ref 0.61–1.24)
GFR, Estimated: 56 mL/min — ABNORMAL LOW (ref >60)
Glucose, Bld: 160 mg/dL — ABNORMAL HIGH (ref 70–99)
Potassium: 4.9 mmol/L (ref 3.5–5.1)
Sodium: 140 mmol/L (ref 135–145)

## 2020-07-10 LAB — URINALYSIS, ROUTINE W REFLEX MICROSCOPIC
Bacteria, UA: NONE SEEN
Bilirubin Urine: NEGATIVE
Glucose, UA: NEGATIVE mg/dL
Hgb urine dipstick: NEGATIVE
Ketones, ur: NEGATIVE mg/dL
Leukocytes,Ua: NEGATIVE
Nitrite: NEGATIVE
Protein, ur: 30 mg/dL — AB
Specific Gravity, Urine: 1.02 (ref 1.005–1.030)
pH: 5 (ref 5.0–8.0)

## 2020-07-10 LAB — CBG MONITORING, ED: Glucose-Capillary: 146 mg/dL — ABNORMAL HIGH (ref 70–99)

## 2020-07-10 NOTE — ED Notes (Signed)
Report called to [facility]

## 2020-07-10 NOTE — ED Triage Notes (Signed)
Pt BIB [facility] from [facility] for unwitnessed mechanical falls last night and this morning. Pt uses walker, tripped on walker. 1 inch laceration to back of the head. Bleeding control, no complaints of pain. No blood thinners denies LOC. A&Ox4. Family wants him to get checked out.  BP 138/84 HR 66 RR 16 SpO2 98% RA CBG 227 Temp 97

## 2020-07-10 NOTE — ED Notes (Signed)
Pt was found sitting on the floor stating he "slipped out of bed." Pt was advised that he needs to use the call bell before getting out of bed. Pt states "I forgot about the button." Pt was assisted back in the bed w/o incident. Yellow socks, yellow band, bed alarm was all placed. PA, Eugene Garnet was notified and is at the bedside assessing pt. Pt was provided w/ urinal and given call bell and reminded again not to get out of bed w/o assistance.

## 2020-07-10 NOTE — ED Notes (Signed)
Attempted to call report to [facility]. Left a message with a call back number. Will attempt to call again.

## 2020-07-10 NOTE — Discharge Instructions (Addendum)
Dennis Allen was evaluated in the emergency room today after his fall.  His physical exam and vital signs were very reassuring.  His blood work and his urine tests are also reassuring, there is no sign of infection.  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  Does not cause concern immediately, however he should follow-up in 6 weeks for another scan of his head.  He should return to the emergency department if he has any more falls with head trauma, if he loses consciousness, or if he is any other severe symptoms.

## 2020-07-10 NOTE — ED Provider Notes (Signed)
  Face-to-face evaluation   History: Alert elderly male who is calm and appears comfortable.  He reportedly fell yesterday and again today while either walking with his walker, or without it.  Patient is a reluctant historian  Physical exam: Alert elderly male he is cooperative.  He is nontoxic in appearance.  He is irritable.  He is able to sit up with minimal support.  He has a somewhat subacute appearing injury to the mid occiput, with laceration and contusion, no bleeding or swelling.  He moves all extremities equally.  Medical screening examination/treatment/procedure(s) were conducted as a shared visit with non-physician practitioner(s) and myself.  I personally evaluated the patient during the encounter    Daleen Bo, MD 07/12/20 1623

## 2020-07-10 NOTE — ED Provider Notes (Signed)
[facility] DEPT Provider Note   CSN: [id number] Arrival date & time: 07/10/20  [id number]     History Chief Complaint  Patient presents with  . Fall  . Laceration    Dennis Allen is a 80 y.o. male who presents from [place] facility after 2 unwitnessed mechanical falls, one last night, and one this morning. Patient walks using walker, states that he simply tripped on his walker both last night and this morning when he was walking to the cafeteria food line.  Patient continues to reiterate "I am ready to go home".  He does not want to be here.   Endorses small cut to the back of his head, but denies any loss of consciousness, nausea, vomiting, chest pain, palpitations, shortness of breath preceding or after his fall.  He denies sense of disequilibrium, confusion, dizziness, lightheadedness.  States he simply tripped both times he fell.  Falls were unwitnessed.  Patient is not on any anticoagulation.  He states he presented because his wife requested that the facility send him for medical evaluation.  Personally reviewed the patient's medical records.  History of diabetes, hypertension.   HPI     Past Medical History:  Diagnosis Date  . Diabetes mellitus without complication ([place])   . Hard of hearing   . Hypertension     There are no problems to display for this patient.   Past Surgical History:  Procedure Laterality Date  . ABDOMINAL SURGERY         Family History  Family history unknown: Yes    Social History   Tobacco Use  . Smoking status: Former Research scientist (life sciences)  . Smokeless tobacco: Never Used  Substance Use Topics  . Alcohol use: Yes    Comment: occ  . Drug use: No    Home Medications Prior to Admission medications   Medication Sig Start Date End Date Taking? Authorizing Provider  acetaminophen (TYLENOL) 500 MG tablet Take 500 mg by mouth every 6 (six) hours as needed for mild pain, fever or headache.   Yes [provider]  alum & mag hydroxide-simeth (MAALOX PLUS) 400-400-40 MG/5ML suspension Take 15 mLs by mouth every 6 (six) hours as needed for indigestion (heartburn).   Yes [provider]  Cholecalciferol 1.25 MG (50000 UT) capsule Take 50,000 Units by mouth once a week. 10/20/19  Yes [provider]  guaiFENesin (ROBITUSSIN) 100 MG/5ML SOLN Take 10 mLs by mouth every 4 (four) hours as needed for cough or to loosen phlegm.   Yes [provider]  levothyroxine (SYNTHROID) 100 MCG tablet Take 100 mcg by mouth daily. 06/27/20  Yes [provider]  loperamide (IMODIUM) 2 MG capsule Take 2 mg by mouth as needed for diarrhea or loose stools.   Yes [provider]  LORazepam (ATIVAN) 0.5 MG tablet Take 0.5 mg by mouth 2 (two) times daily as needed for anxiety. 07/06/20  Yes [provider]  magnesium hydroxide (MILK OF MAGNESIA) 400 MG/5ML suspension Take 30 mLs by mouth at bedtime as needed for mild constipation.   Yes [provider]  metFORMIN (GLUCOPHAGE) 500 MG tablet Take 500 mg by mouth 2 (two) times daily. 06/27/20  Yes [provider]  neomycin-bacitracin-polymyxin (NEOSPORIN) ointment Apply 1 application topically as needed for wound care.   Yes [provider]  amLODipine-olmesartan (AZOR) 10-40 MG tablet Take 1 tablet by mouth daily. Patient not taking: Reported on 07/10/2020 11/12/17 01/17/18  [name], MD  glipiZIDE Faylene Kurtz)  5 MG tablet Take 1 tablet (5 mg total) by mouth daily before breakfast. Patient not taking: Reported on 07/10/2020 01/19/18   [name], MD  metFORMIN (GLUCOPHAGE-XR) 500 MG 24 hr tablet Take 2 tablets (1,000 mg total) by mouth at bedtime. Patient not taking: Reported on 07/10/2020 11/12/17 01/17/18  [name], MD  metFORMIN (GLUCOPHAGE-XR) 500 MG 24 hr tablet Take 2 tablets (1,000 mg total) by mouth 2 (two) times daily. Patient not taking: Reported on 07/10/2020 01/19/18    [name], MD    Allergies    Patient has no known allergies.  Review of Systems   Review of Systems  Constitutional: Negative.   HENT: Negative.   Eyes: Negative.   Respiratory: Negative.   Cardiovascular: Negative.   Gastrointestinal: Negative.   Skin: Positive for wound.       Laceration, posterior scalp.  Neurological: Negative.   Hematological: Negative.   Psychiatric/Behavioral: Negative.     Physical Exam Updated Vital Signs BP 137/84   Pulse 81   Temp 97.8 F (36.6 C) (Oral)   Resp 14   SpO2 100%   Physical Exam Vitals and nursing note reviewed.  HENT:     Head: Normocephalic.      Nose: Nose normal.     Mouth/Throat:     Mouth: Mucous membranes are moist.     Pharynx: Oropharynx is clear. Uvula midline. No oropharyngeal exudate or posterior oropharyngeal erythema.     Tonsils: No tonsillar exudate.  Eyes:     General: Lids are normal. Vision grossly intact.        Right eye: No discharge.        Left eye: No discharge.     Extraocular Movements: Extraocular movements intact.     Conjunctiva/sclera: Conjunctivae normal.     Pupils: Pupils are equal, round, and reactive to light.  Neck:     Trachea: Trachea and phonation normal.  Cardiovascular:     Rate and Rhythm: Normal rate and regular rhythm.     Pulses:          Radial pulses are 2+ on the right side and 2+ on the left side.       Dorsalis pedis pulses are 1+ on the right side and 1+ on the left side.     Heart sounds: Normal heart sounds. No murmur heard.   Pulmonary:     Effort: Pulmonary effort is normal. No respiratory distress.     Breath sounds: Normal breath sounds. No wheezing or rales.  Chest:     Chest wall: No deformity, swelling, tenderness, crepitus or edema.  Abdominal:     General: Bowel sounds are normal. There is no distension.     Palpations: Abdomen is soft.     Tenderness: There is no abdominal tenderness.  Musculoskeletal:        General: No deformity.     Right  shoulder: Normal.     Left shoulder: Normal.     Right upper arm: Normal.     Left upper arm: Normal.     Right elbow: Normal.     Left elbow: Normal.     Right forearm: Normal.     Left forearm: Normal.     Right wrist: Normal.     Left wrist: Normal.     Right hand: Normal.     Left hand: Normal.     Cervical back: Neck supple. No rigidity, tenderness, bony tenderness or crepitus. No pain with movement, spinous  process tenderness or muscular tenderness.     Thoracic back: Normal. No spasms, tenderness or bony tenderness.     Lumbar back: Normal. No tenderness or bony tenderness. Negative right straight leg raise test and negative left straight leg raise test.     Right hip: Normal.     Left hip: Normal.     Right upper leg: Normal.     Left upper leg: Normal.     Right knee: Normal.     Left knee: Normal.     Right lower leg: Normal. No edema.     Left lower leg: Normal. No edema.     Right ankle: Normal.     Right Achilles Tendon: Normal.     Left ankle: Normal.     Left Achilles Tendon: Normal.     Right foot: Normal.     Left foot: Normal.     Comments: 4/5 symmetric grip strength bilaterally. 5/5 plantar dorsiflexion strength bilaterally.  Multiple small healing skin tears on the forearms bilaterally.   Lymphadenopathy:     Cervical: No cervical adenopathy.  Skin:    General: Skin is warm and dry.     Capillary Refill: Capillary refill takes less than 2 seconds.  Neurological:     General: No focal deficit present.     Mental Status: He is alert and oriented to person, place, and time. Mental status is at baseline.     Cranial Nerves: Cranial nerves are intact.     Sensory: Sensation is intact.     Motor: Motor function is intact.  Psychiatric:        Mood and Affect: Mood is depressed. Affect is angry.     ED Results / Procedures / Treatments   Labs (all labs ordered are listed, but only abnormal results are displayed) Labs Reviewed  CBC WITH  DIFFERENTIAL/PLATELET - Abnormal; Notable for the following components:      Result Value   RBC 2.27 (*)    Hemoglobin 9.7 (*)    HCT 27.4 (*)    MCV 120.7 (*)    MCH 42.7 (*)    Neutro Abs 9.2 (*)    All other components within normal limits  BASIC METABOLIC PANEL - Abnormal; Notable for the following components:   CO2 33 (*)    Glucose, Bld 160 (*)    BUN 24 (*)    Creatinine, Ser 1.28 (*)    GFR, Estimated 56 (*)    All other components within normal limits  URINALYSIS, ROUTINE W REFLEX MICROSCOPIC - Abnormal; Notable for the following components:   Protein, ur 30 (*)    All other components within normal limits  CBG MONITORING, ED - Abnormal; Notable for the following components:   Glucose-Capillary 146 (*)    All other components within normal limits    EKG EKG Interpretation  Date/Time:  Sunday July 10 2020 11:39:00 EST Ventricular Rate:  73 PR Interval:    QRS Duration: 112 QT Interval:  414 QTC Calculation: 457 R Axis:   -75 Text Interpretation: Sinus rhythm Atrial premature complex Short PR interval Incomplete right bundle branch block Abnormal R-wave progression, late transition Inferior infarct, old Lateral leads are also involved No old tracing to compare Confirmed by Daleen Bo [id number]) on 07/10/2020 12:18:08 PM   Radiology CT Head Wo Contrast  Result Date: 07/10/2020 CLINICAL DATA:  Unwitnessed fall last night and this morning. Laceration posterior scalp. EXAM: CT HEAD WITHOUT CONTRAST CT CERVICAL SPINE WITHOUT CONTRAST TECHNIQUE: Multidetector  CT imaging of the head and cervical spine was performed following the standard protocol without intravenous contrast. Multiplanar CT image reconstructions of the cervical spine were also generated. COMPARISON:  None. FINDINGS: CT HEAD FINDINGS Brain: Ventricles and cisterns are normal. There is evidence of moderate chronic ischemic microvascular disease. Findings suggesting old bilateral occipital infarct right worse  than left. 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 Couple tiny hyperdense foci along the anterior aspect of the S subdural collection likely prominent vessels, although could not exclude mild acute component. No evidence of mass, mass effect or shift of midline structures. Punctate calcification over the right side of the pons. Vascular: No hyperdense vessel or unexpected calcification. Skull: No skull fracture.  Suggestion of old nasal bone fracture. Sinuses/Orbits: Paranasal sinuses are clear. Orbits are normal and symmetric. Other: Small scalp contusion over the left posterior process occipital region. CT CERVICAL SPINE FINDINGS Alignment: Subtle 2 mm posterior subluxation of C4 on C5 due to degenerative changes/facet arthropathy. No traumatic subluxation. Skull base and vertebrae: Vertebral bodies are normal. There is moderate spondylosis throughout the cervical spine. Moderate uncovertebral joint spurring and facet arthropathy. Moderate bilateral neural foraminal narrowing from the C4-5 level to the C6-7 level and to lesser extent at the C3-4 level. No acute cervical spine fracture. There is a minimally displaced fracture of the base of the T2 spinous process. There is also a minimally displaced fracture the base of the T3 spinous process and possible fracture at the distal tip of the T4 spinous process which is not completely evaluated on this exam. Soft tissues and spinal canal: Prevertebral soft tissues are normal. Minimal canal stenosis at the C4-5 level due to posterior osteophyte formation and disc disease. Disc levels: Moderate disc space narrowing at the C4-5, C5-6 and C6-7 levels. Upper chest: No acute findings. Other: None. IMPRESSION: 1. 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 Couple tiny hyperdense foci along the anterior aspect of the  collection likely prominent vessels, although could not exclude mild acute component. No mass effect or shift of midline structures. 2. 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 3. No acute cervical spine injury. 4. Minimally displaced fractures of the base of the T2 and T3 spinous processes as well as possible fracture at the distal tip of the T4 spinous process which is not completely evaluated on this exam. 5. Moderate spondylosis throughout the cervical spine with moderate multilevel disc disease and multilevel bilateral neural foraminal narrowing as described. Minimal canal stenosis at the C4-5 level due to posterior osteophyte formation and disc disease. These results were called by telephone at the time of interpretation on 07/10/2020 at 11:46 am to provider [facility] , who verbally acknowledged these results. Electronically Signed   By: Marin Olp M.D.   On: 07/10/2020 11:46   CT Cervical Spine Wo Contrast  Result Date: 07/10/2020 CLINICAL DATA:  Unwitnessed fall last night and this morning. Laceration posterior scalp. EXAM: CT HEAD WITHOUT CONTRAST CT CERVICAL SPINE WITHOUT CONTRAST TECHNIQUE: Multidetector CT imaging of the head and cervical spine was performed following the standard protocol without intravenous contrast. Multiplanar CT image reconstructions of the cervical spine were also generated. COMPARISON:  None. FINDINGS: CT HEAD FINDINGS Brain: Ventricles and cisterns are normal. There is evidence of moderate chronic ischemic microvascular disease. Findings suggesting old bilateral occipital infarct  right worse than left. 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 Couple tiny hyperdense foci along the anterior aspect of the S subdural collection likely prominent vessels, although could not exclude mild acute component. No evidence of mass, mass  effect or shift of midline structures. Punctate calcification over the right side of the pons. Vascular: No hyperdense vessel or unexpected calcification. Skull: No skull fracture.  Suggestion of old nasal bone fracture. Sinuses/Orbits: Paranasal sinuses are clear. Orbits are normal and symmetric. Other: Small scalp contusion over the left posterior process occipital region. CT CERVICAL SPINE FINDINGS Alignment: Subtle 2 mm posterior subluxation of C4 on C5 due to degenerative changes/facet arthropathy. No traumatic subluxation. Skull base and vertebrae: Vertebral bodies are normal. There is moderate spondylosis throughout the cervical spine. Moderate uncovertebral joint spurring and facet arthropathy. Moderate bilateral neural foraminal narrowing from the C4-5 level to the C6-7 level and to lesser extent at the C3-4 level. No acute cervical spine fracture. There is a minimally displaced fracture of the base of the T2 spinous process. There is also a minimally displaced fracture the base of the T3 spinous process and possible fracture at the distal tip of the T4 spinous process which is not completely evaluated on this exam. Soft tissues and spinal canal: Prevertebral soft tissues are normal. Minimal canal stenosis at the C4-5 level due to posterior osteophyte formation and disc disease. Disc levels: Moderate disc space narrowing at the C4-5, C5-6 and C6-7 levels. Upper chest: No acute findings. Other: None. IMPRESSION: 1. 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 Couple tiny hyperdense foci along the anterior aspect of the collection likely prominent vessels, although could not exclude mild acute component. No mass effect or shift of midline structures. 2. 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 3. No acute cervical spine injury. 4. Minimally displaced fractures of the base of  the T2 and T3 spinous processes as well as possible fracture at the distal tip of the T4 spinous process which is not completely evaluated on this exam. 5. Moderate spondylosis throughout the cervical spine with moderate multilevel disc disease and multilevel bilateral neural foraminal narrowing as described. Minimal canal stenosis at the C4-5 level due to posterior osteophyte formation and disc disease. These results were called by telephone at the time of interpretation on 07/10/2020 at 11:46 am to provider [facility] , who verbally acknowledged these results. Electronically Signed   By: Marin Olp M.D.   On: 07/10/2020 11:46   DG HIPS BILAT WITH PELVIS 3-4 VIEWS  Result Date: 07/10/2020 CLINICAL DATA:  Unwitnessed fall last night and this morning. EXAM: DG HIP (WITH OR WITHOUT PELVIS) 3-4V BILAT COMPARISON:  None. FINDINGS: Mild symmetric degenerative change of the hips. No evidence of acute fracture or dislocation. Mild degenerate change of the spine. Calcified plaque over the femoral arteries. IMPRESSION: 1. No acute findings. 2. Mild symmetric degenerative change of the hips. Electronically Signed   By: Marin Olp M.D.   On: 07/10/2020 14:51    Procedures .Marland KitchenLaceration Repair  Date/Time: 07/10/2020 3:51 PM Performed by: Emeline Darling, PA-C Authorized by: Emeline Darling, PA-C   Consent:    Consent obtained:  Verbal   Consent given by:  Patient   Risks discussed:  Infection, need for additional repair, pain, poor cosmetic result and poor wound healing  Alternatives discussed:  No treatment and delayed treatment Universal protocol:    Procedure explained and questions answered to patient or proxy's satisfaction: yes     Relevant documents present and verified: yes     Test results available: yes     Imaging studies available: yes     Required blood products, implants, devices, and special equipment available: yes     Site/side marked: yes     Immediately prior  to procedure, a time out was called: yes     Patient identity confirmed:  Verbally with patient Anesthesia:    Anesthesia method:  None Laceration details:    Location:  Scalp   Scalp location:  Occipital   Length (cm):  1.5 Exploration:    Limited defect created (wound extended): no     Imaging obtained comment:  CT   Imaging outcome: foreign body not noted     Wound extent: no foreign bodies/material noted, no tendon damage noted and no underlying fracture noted   Treatment:    Area cleansed with:  Saline   Amount of cleaning:  Standard   Irrigation solution:  Sterile saline   Debridement:  None Skin repair:    Repair method:  Steri-Strips   Number of Steri-Strips:  2 Approximation:    Approximation:  Close Repair type:    Repair type:  Simple Post-procedure details:    Dressing:  Open (no dressing)   Procedure completion:  Tolerated well, no immediate complications   (including critical care time)  Medications Ordered in ED Medications - No data to display  ED Course  I have reviewed the triage vital signs and the nursing notes.  Pertinent labs & imaging results that were available during my care of the patient were reviewed by me and considered in my medical decision making (see chart for details).  Clinical Course as of 07/10/20 Cher Nakai Jul 10, 2020  1235 Nursing staff notified this provider that the patient was found sitting on his buttocks in the corner of the room.  No bed alarm had been set, door had been closed.  Patient was reevaluated by this provider.  He is in no acute distress.  He endorses mild tenderness palpation of his right hip and buttocks, without obvious bruising or deformity.  He states he was trying to get up to go to use the restroom and then leave, when he fell.  Patient now in gripping socks, with bed alarm, and subtle Trendelenburg in his hospital bed.  He was provided a urinal, the door was left open to the room.  Nursing staff notified that  patient continues to be stable.  Will proceed with plain film of the bilateral hips and pelvis at this time. [RS]  [id number] Patient continues to try to climb out of bed; soft waist belt ordered for patient safety.  [RS]    Clinical Course User Index [RS] Jeovany Huitron, Gypsy Balsam, PA-C   MDM Rules/Calculators/A&P                         80 year old male with history of diabetes and hypertension who presents after 2 unwitnessed falls at his facility.  Patient hypertensive on intake 146/77.  Vital signs otherwise normal.  Physical exam reassuring.  Cardiopulmonary exam is normal, neurologic exam without focal abnormality.  Patient is alert and oriented x4; depressed and angry mood.  Given the unwitnessed nature of these falls in context of an elderly male, will proceed with basic  laboratory studies, UA, EKG and CT head. Analgesia offered, patient declined.   Critical radiology result called from radiologist, Dr. Derrel Nip, who notified this provider of small subacute to chronic left subdural hematoma most prominent over the frontal region, 1 cm in thickness.  Additionally there are minimally displaced fractures of the base of T2 and T3 spinous processes as well as possible fractures of distal T4 spinous process, likely chronic changes, given lack of TTP of the thoracic spine at this time.  Consult placed to neurosurgeon, Dr. Marcello Moores, he does not feel any further work-up is warranted for subacute subdural hematoma at this time.  Recommending repeat Noncon CT of the head in approximately 6 weeks to evaluate for expansion of the hematoma. I appreciate his collaboration in the care of this patient.  CBC with anemia hemoglobin 9.7, without leukocytosis.  BMP with elevated BUN/creatinine, 24/1.28, at patient baseline. Plain film of the hips and pelvis obtained after patient fall in the ER, negative for acute fracture or dislocation.  At time of my reevaluation of the patient he is requesting that I show him how to  make phone calls, however he handed me his wallet. States that he "just got this new phone and I do not know how to use it".  He seems confused when I explained that that is not a telephone, that he cannot make phone calls with it.  He states he does not need to urinate, refuses to try to give urine sample.  Awaiting urine sample in order to disposition this patient.  UA negative for infection. Given reassuring physical exam, vital signs, and laboratory/imaging studies, no further workup is warranted in the ED at this time.   At the time of my reevaluation the patient is resting, his hospital bed.  He is eager to be discharged home.  Requesting taxi or bus pass.  Return precautions given in discharge paperwork.  Patient is well-appearing, stable, and appropriate for discharge at this time.  This chart was dictated using voice recognition software, Dragon. Despite the best efforts of this provider to proofread and correct errors, errors may still occur which can change documentation meaning.  Final Clinical Impression(s) / ED Diagnoses Final diagnoses:  Fall    Rx / DC Orders ED Discharge Orders    None       Aura Dials 07/10/20 1723    Daleen Bo, MD 07/12/20 1623

## 2020-07-10 NOTE — ED Notes (Signed)
Pt states that he is unable to provide urine sample at this time, will attempt at a later time. RN notified.

## 2020-07-10 NOTE — ED Notes (Signed)
Pt expressed agitation over cords and wires connecting him to monitor. This writer unattached all cords and wires under supervision of RN. Staff will continue to monitor pt as needed.

## 2020-07-10 NOTE — ED Notes (Signed)
Pt assisted to bathroom with steady walker

## 2020-07-12 ENCOUNTER — Emergency Department (HOSPITAL_COMMUNITY): Payer: Medicare Other

## 2020-07-12 ENCOUNTER — Encounter (HOSPITAL_COMMUNITY): Payer: Self-pay

## 2020-07-12 ENCOUNTER — Other Ambulatory Visit: Payer: Self-pay

## 2020-07-12 ENCOUNTER — Emergency Department (HOSPITAL_COMMUNITY)
Admission: EM | Admit: 2020-07-12 | Discharge: 2020-07-13 | Payer: Medicare Other | Attending: Emergency Medicine | Admitting: Emergency Medicine

## 2020-07-12 DIAGNOSIS — Z79899 Other long term (current) drug therapy: Secondary | ICD-10-CM | POA: Diagnosis not present

## 2020-07-12 DIAGNOSIS — F0390 Unspecified dementia without behavioral disturbance: Secondary | ICD-10-CM | POA: Insufficient documentation

## 2020-07-12 DIAGNOSIS — S0100XA Unspecified open wound of scalp, initial encounter: Secondary | ICD-10-CM | POA: Diagnosis not present

## 2020-07-12 DIAGNOSIS — Z7984 Long term (current) use of oral hypoglycemic drugs: Secondary | ICD-10-CM | POA: Diagnosis not present

## 2020-07-12 DIAGNOSIS — S40022A Contusion of left upper arm, initial encounter: Secondary | ICD-10-CM | POA: Diagnosis not present

## 2020-07-12 DIAGNOSIS — I10 Essential (primary) hypertension: Secondary | ICD-10-CM | POA: Insufficient documentation

## 2020-07-12 DIAGNOSIS — Z87891 Personal history of nicotine dependence: Secondary | ICD-10-CM | POA: Insufficient documentation

## 2020-07-12 DIAGNOSIS — S50312A Abrasion of left elbow, initial encounter: Secondary | ICD-10-CM | POA: Diagnosis not present

## 2020-07-12 DIAGNOSIS — R040 Epistaxis: Secondary | ICD-10-CM | POA: Diagnosis present

## 2020-07-12 DIAGNOSIS — Y9301 Activity, walking, marching and hiking: Secondary | ICD-10-CM | POA: Diagnosis not present

## 2020-07-12 DIAGNOSIS — E119 Type 2 diabetes mellitus without complications: Secondary | ICD-10-CM | POA: Diagnosis not present

## 2020-07-12 DIAGNOSIS — W010XXA Fall on same level from slipping, tripping and stumbling without subsequent striking against object, initial encounter: Secondary | ICD-10-CM | POA: Insufficient documentation

## 2020-07-12 DIAGNOSIS — S50311A Abrasion of right elbow, initial encounter: Secondary | ICD-10-CM | POA: Diagnosis not present

## 2020-07-12 DIAGNOSIS — S8011XA Contusion of right lower leg, initial encounter: Secondary | ICD-10-CM | POA: Diagnosis not present

## 2020-07-12 DIAGNOSIS — S8012XA Contusion of left lower leg, initial encounter: Secondary | ICD-10-CM | POA: Insufficient documentation

## 2020-07-12 DIAGNOSIS — W19XXXA Unspecified fall, initial encounter: Secondary | ICD-10-CM

## 2020-07-12 DIAGNOSIS — S40021A Contusion of right upper arm, initial encounter: Secondary | ICD-10-CM | POA: Insufficient documentation

## 2020-07-12 NOTE — ED Triage Notes (Signed)
Patient arrived from [place] after three unwitnessed falls today and had some bleeding around his nose. Patient has dementia at baseline. No blood thinners no LOC.

## 2020-07-12 NOTE — ED Provider Notes (Signed)
[facility] DEPT Provider Note   CSN: [id number] Arrival date & time: 07/12/20  2115     History Chief Complaint  Patient presents with  . Fall    Dennis Allen is a 80 y.o. male with a hx of dementia, hypertension, & diabetes mellitus who presents to the ED from [facility] facility after unwitnessed fall today. Patient states he was walking with his walker and tripped over it causing him to fall. He states his nose was bleeding but he feels fine. He does not think he had LOC. He is complaining that he is hungry and wants to go back to his facility. Denies pain.   I called & spoke with staff & Stephan Minister- person I spoke to relays that the patient had one unwitnessed fall tonight, was trying to walk with his walker and was found on the floor with bleeding from his nose therefore they sent him to the ED. He has been at his mental status baseline. He does ambulate with a walker but tends to lean to one side. In terms of the 3 falls mentioned in triage- she relays that this was the only fall he had today, did have prior falls when he was seen in our ED a coupe of days ago.  HPI     Past Medical History:  Diagnosis Date  . Diabetes mellitus without complication ([place])   . Hard of hearing   . Hypertension     There are no problems to display for this patient.   Past Surgical History:  Procedure Laterality Date  . ABDOMINAL SURGERY         Family History  Family history unknown: Yes    Social History   Tobacco Use  . Smoking status: Former Research scientist (life sciences)  . Smokeless tobacco: Never Used  Substance Use Topics  . Alcohol use: Yes    Comment: occ  . Drug use: No    Home Medications Prior to Admission medications   Medication Sig Start Date End Date Taking? Authorizing Provider  acetaminophen (TYLENOL) 500 MG tablet Take 500 mg by mouth every 6 (six) hours as needed for mild pain, fever or headache.    [provider]  alum &  mag hydroxide-simeth (MAALOX PLUS) 400-400-40 MG/5ML suspension Take 15 mLs by mouth every 6 (six) hours as needed for indigestion (heartburn).    [provider]  amLODipine-olmesartan (AZOR) 10-40 MG tablet Take 1 tablet by mouth daily. Patient not taking: Reported on 07/10/2020 11/12/17 01/17/18  [name], MD  Cholecalciferol 1.25 MG (50000 UT) capsule Take 50,000 Units by mouth once a week. 10/20/19   [provider]  glipiZIDE (GLUCOTROL) 5 MG tablet Take 1 tablet (5 mg total) by mouth daily before breakfast. Patient not taking: Reported on 07/10/2020 01/19/18   [name], MD  guaiFENesin (ROBITUSSIN) 100 MG/5ML SOLN Take 10 mLs by mouth every 4 (four) hours as needed for cough or to loosen phlegm.    [provider]  levothyroxine (SYNTHROID) 100 MCG tablet Take 100 mcg by mouth daily. 06/27/20   [provider]  loperamide (IMODIUM) 2 MG capsule Take 2 mg by mouth as needed for diarrhea or loose stools.    [provider]  LORazepam (ATIVAN) 0.5 MG tablet Take 0.5 mg by mouth 2 (two) times daily as needed for anxiety. 07/06/20   [provider]  magnesium hydroxide (MILK OF MAGNESIA) 400 MG/5ML suspension Take 30 mLs by mouth at bedtime as needed for  mild constipation.    [provider]  metFORMIN (GLUCOPHAGE) 500 MG tablet Take 500 mg by mouth 2 (two) times daily. 06/27/20   [provider]  metFORMIN (GLUCOPHAGE-XR) 500 MG 24 hr tablet Take 2 tablets (1,000 mg total) by mouth at bedtime. Patient not taking: Reported on 07/10/2020 11/12/17 01/17/18  [name], MD  metFORMIN (GLUCOPHAGE-XR) 500 MG 24 hr tablet Take 2 tablets (1,000 mg total) by mouth 2 (two) times daily. Patient not taking: Reported on 07/10/2020 01/19/18   [name], MD  neomycin-bacitracin-polymyxin (NEOSPORIN) ointment Apply 1 application topically as needed for wound care.    [provider]    Allergies    Patient has no  known allergies.  Review of Systems   Review of Systems  Constitutional: Negative for fever.  Eyes: Negative for visual disturbance.  Respiratory: Negative for shortness of breath.   Cardiovascular: Negative for chest pain.  Gastrointestinal: Negative for abdominal pain.  Musculoskeletal: Negative for arthralgias, back pain and neck pain.  Neurological: Negative for headaches.  Psychiatric/Behavioral: Negative for confusion (from baseline).  All other systems reviewed and are negative.   Physical Exam Updated Vital Signs BP (!) 141/83 (BP Location: Left Arm)   Pulse 83   Temp 98.1 F (36.7 C) (Oral)   Resp 16   SpO2 98%   Physical Exam Vitals and nursing note reviewed.  Constitutional:      General: He is not in acute distress.    Appearance: He is not toxic-appearing.  HENT:     Head:     Comments: No raccoon eyes or battle sign. Linear wound to posterior scalp with overlying dressing- no active bleeding. No obvious hematomas or acute lacerations. No facial bony tenderness    Ears:     Comments: No hemotympanums.    Nose:     Comments: Dried blood to the bilateral anterior naris.  No active bleeding.  No visible septal hematoma.  No nasal bone tenderness to palpation. Eyes:     Extraocular Movements: Extraocular movements intact.     Pupils: Pupils are equal, round, and reactive to light.  Cardiovascular:     Rate and Rhythm: Normal rate and regular rhythm.     Pulses: Normal pulses.  Pulmonary:     Effort: Pulmonary effort is normal.     Breath sounds: Normal breath sounds.  Abdominal:     General: There is no distension.     Palpations: Abdomen is soft.     Tenderness: There is no abdominal tenderness.  Musculoskeletal:     Cervical back: Normal range of motion and neck supple. No tenderness.     Comments: No obvious deformities appreciated. Upper extremities: Patient is actively able to range all major joints of the upper extremities.  He has no focal bony  tenderness to palpation. Back: Small abrasion to left thoracic paraspinal muscle region.  There is no tenderness to this location.  There is no ecchymosis.  Patient has no point/focal vertebral tenderness or palpable step-off. Lower extremities: Patient is able to actively range all major joints of the lower extremities.  He is able to raise his legs off of the bed and bend his knees without difficulty.  He has no focal bony tenderness.  No tenderness or palpable instability with pelvis palpation.  Neurological:     Mental Status: He is alert.     Comments: Alert.  Clear speech.  CN III through XII grossly intact.  Sensation grossly intact bilateral upper and  lower extremities.  5 out of 5 symmetric grip strength.  5 out of 5 strength with plantar dorsiflexion bilaterally.     ED Results / Procedures / Treatments   Labs (all labs ordered are listed, but only abnormal results are displayed) Labs Reviewed - No data to display  EKG None  Radiology No results found.  Procedures Procedures (including critical care time)  Medications Ordered in ED Medications - No data to display  ED Course  I have reviewed the triage vital signs and the nursing notes.  Pertinent labs & imaging results that were available during my care of the patient were reviewed by me and considered in my medical decision making (see chart for details).    MDM Rules/Calculators/A&P                          Patient presents to the ED S/p unwitnessed fall at [facility] care facility.  On ER arrival patient is nontoxic, his vitals are without significant abnormality, he has no complaints other than he would like something to eat and would like to go home.  On exam he does have some dried blood to the bilateral nares, no active bleeding or visible septal hematoma.  No other obvious signs of acute injury.  Additional history obtained:  Additional history obtained from chart review nursing note reviewed. 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 Also revealed some thoracic fxs that were felt to be chronic by provider at that time. Hip x-rays w/o acute injury. CBC with anemia hemoglobin 9.7, without leukocytosis.  BMP with elevated BUN/creatinine, 24/1.28, at patient baseline. UA without acute UTI.  EKG was also obtained.  Fall seems to have been mechanical in nature per discussion with the patient, he also just had a recent work-up with an EKG and labs for a similar presentation therefore do not feel these need repeating given he is at his mental status baseline.  Imaging Studies ordered:  I ordered imaging studies which included CT head/C spine, I independently visualized and interpreted imaging which showed no acute process, additional findings similar to recent CT imaging from 07/10/20.   No acute injury on CT head/C-spine.  No additional midline spinal tenderness or focal neurologic deficits to raise concern for head bleed or spinal fracture.  No chest or abdominal tenderness to raise concern for intrathoracic/abdominal injury.  Patient is moving his extremities in all major joints actively, he has no areas of focal bony tenderness, he was able to bear weight with his walker, do not suspect extremity fracture at this time.  He overall appears appropriate for discharge back to his facility, I called and spoke with his wife and provided an update with his results and plan of care. Patient resting comfortably eating a sandwich, feels ready to return to his facility.  I discussed results, treatment plan, need for follow-up, and return precautions with the patient and his via phone. Provided opportunity for questions, patient & his  wife confirmed understanding and are in agreement with plan.   Findings and plan of care discussed with supervising physician Dr. Billy Fischer who is in agreement.   Portions of this note were generated with [employer]. Dictation errors  may occur despite best attempts at proofreading.  Final Clinical Impression(s) / ED Diagnoses Final diagnoses:  Fall, initial encounter    Rx / DC Orders ED Discharge Orders    None       Amaryllis Dyke, PA-C 07/13/20 0116    Gareth Morgan, MD 07/13/20 [phone]

## 2020-07-12 NOTE — ED Notes (Signed)
Patient assisted with using the urinal.  

## 2020-07-12 NOTE — ED Notes (Signed)
Patient to CT.

## 2020-07-12 NOTE — ED Notes (Signed)
This RN spoke with the patient's wife to give an update.

## 2020-07-13 ENCOUNTER — Emergency Department (HOSPITAL_COMMUNITY)
Admission: EM | Admit: 2020-07-13 | Discharge: 2020-07-14 | Disposition: A | Discharge status: Home / Self Care | Payer: Medicare Other | Source: Home / Self Care | Attending: Emergency Medicine | Admitting: Emergency Medicine

## 2020-07-13 ENCOUNTER — Encounter (HOSPITAL_COMMUNITY): Payer: Self-pay

## 2020-07-13 ENCOUNTER — Other Ambulatory Visit: Payer: Self-pay

## 2020-07-13 DIAGNOSIS — S8011XA Contusion of right lower leg, initial encounter: Secondary | ICD-10-CM | POA: Insufficient documentation

## 2020-07-13 DIAGNOSIS — S40021A Contusion of right upper arm, initial encounter: Secondary | ICD-10-CM | POA: Insufficient documentation

## 2020-07-13 DIAGNOSIS — S50311A Abrasion of right elbow, initial encounter: Secondary | ICD-10-CM | POA: Insufficient documentation

## 2020-07-13 DIAGNOSIS — I10 Essential (primary) hypertension: Secondary | ICD-10-CM | POA: Insufficient documentation

## 2020-07-13 DIAGNOSIS — E119 Type 2 diabetes mellitus without complications: Secondary | ICD-10-CM | POA: Insufficient documentation

## 2020-07-13 DIAGNOSIS — S8012XA Contusion of left lower leg, initial encounter: Secondary | ICD-10-CM | POA: Insufficient documentation

## 2020-07-13 DIAGNOSIS — W19XXXA Unspecified fall, initial encounter: Secondary | ICD-10-CM | POA: Insufficient documentation

## 2020-07-13 DIAGNOSIS — Z87891 Personal history of nicotine dependence: Secondary | ICD-10-CM | POA: Insufficient documentation

## 2020-07-13 DIAGNOSIS — S0100XA Unspecified open wound of scalp, initial encounter: Secondary | ICD-10-CM | POA: Insufficient documentation

## 2020-07-13 DIAGNOSIS — F0390 Unspecified dementia without behavioral disturbance: Secondary | ICD-10-CM | POA: Insufficient documentation

## 2020-07-13 DIAGNOSIS — Z7984 Long term (current) use of oral hypoglycemic drugs: Secondary | ICD-10-CM | POA: Insufficient documentation

## 2020-07-13 DIAGNOSIS — Z79899 Other long term (current) drug therapy: Secondary | ICD-10-CM | POA: Insufficient documentation

## 2020-07-13 DIAGNOSIS — S50312A Abrasion of left elbow, initial encounter: Secondary | ICD-10-CM | POA: Insufficient documentation

## 2020-07-13 DIAGNOSIS — S40022A Contusion of left upper arm, initial encounter: Secondary | ICD-10-CM | POA: Insufficient documentation

## 2020-07-13 NOTE — Discharge Instructions (Addendum)
You were seen in the emergency department today after a fall.  Your CT scans showed the prior subdural hematoma (brain bleed) as well as the fractures in your upper back that were seen on your prior CT imaging, these have not changed, no other new injury was noted.  Given your frequent falls please discuss possible use of a wheelchair and possible physical therapy at your facility.   Please follow-up with your primary care provider within 3 days.  Return to the ER for any new or worsening symptoms including but not limited to new areas of pain, subsequent falls, blood in urine or stool, coughing up blood, trouble breathing, chest pain, abdominal pain, inability to bear weight, or any other concerns.

## 2020-07-13 NOTE — ED Notes (Signed)
[facility] called for transportation back to [facility].

## 2020-07-13 NOTE — ED Notes (Signed)
Patient assisted to the bathroom 

## 2020-07-13 NOTE — ED Triage Notes (Signed)
Patient arrives from [facility] with [facility], patient had a fall tonight, skin tears to L elbow, hx of dementia and is at baseline neuro per facility, no blood thinners or LOC.

## 2020-07-14 NOTE — ED Notes (Signed)
I called Dennis Allen  To tell them that the pt was coming back there ptar has been called to return the pt there

## 2020-07-14 NOTE — ED Provider Notes (Signed)
[facility] EMERGENCY DEPARTMENT Provider Note   CSN: [id number] Arrival date & time: 07/13/20  2039     History Chief Complaint  Patient presents with  . Fall    Dennis Allen is a 80 y.o. male.  Multiple recent falls with associated injuries. Multiple head ct scans showing improved SDH, returns approcximately 24 hours after another fall. No complaints.   The history is provided by the EMS personnel and the patient.  Fall This is a recurrent problem. The problem occurs rarely. The problem has not changed since onset.Pertinent negatives include no chest pain, no headaches and no shortness of breath.       Past Medical History:  Diagnosis Date  . Diabetes mellitus without complication ([place])   . Hard of hearing   . Hypertension     There are no problems to display for this patient.   Past Surgical History:  Procedure Laterality Date  . ABDOMINAL SURGERY         Family History  Family history unknown: Yes    Social History   Tobacco Use  . Smoking status: Former Research scientist (life sciences)  . Smokeless tobacco: Never Used  Substance Use Topics  . Alcohol use: Yes    Comment: occ  . Drug use: No    Home Medications Prior to Admission medications   Medication Sig Start Date End Date Taking? Authorizing Provider  acetaminophen (TYLENOL) 500 MG tablet Take 500 mg by mouth every 6 (six) hours as needed for mild pain, fever or headache.    [provider]  alum & mag hydroxide-simeth (MAALOX PLUS) 400-400-40 MG/5ML suspension Take 15 mLs by mouth every 6 (six) hours as needed for indigestion (heartburn).    [provider]  amLODipine-olmesartan (AZOR) 10-40 MG tablet Take 1 tablet by mouth daily. Patient not taking: Reported on 07/10/2020 11/12/17 01/17/18  [name], MD  Cholecalciferol 1.25 MG (50000 UT) capsule Take 50,000 Units by mouth once a week. 10/20/19   [provider]  glipiZIDE (GLUCOTROL) 5 MG tablet Take 1  tablet (5 mg total) by mouth daily before breakfast. Patient not taking: Reported on 07/10/2020 01/19/18   [name], MD  guaiFENesin (ROBITUSSIN) 100 MG/5ML SOLN Take 10 mLs by mouth every 4 (four) hours as needed for cough or to loosen phlegm.    [provider]  levothyroxine (SYNTHROID) 100 MCG tablet Take 100 mcg by mouth daily. 06/27/20   [provider]  loperamide (IMODIUM) 2 MG capsule Take 2 mg by mouth as needed for diarrhea or loose stools.    [provider]  LORazepam (ATIVAN) 0.5 MG tablet Take 0.5 mg by mouth 2 (two) times daily as needed for anxiety. 07/06/20   [provider]  magnesium hydroxide (MILK OF MAGNESIA) 400 MG/5ML suspension Take 30 mLs by mouth at bedtime as needed for mild constipation.    [provider]  metFORMIN (GLUCOPHAGE) 500 MG tablet Take 500 mg by mouth 2 (two) times daily. 06/27/20   [provider]  metFORMIN (GLUCOPHAGE-XR) 500 MG 24 hr tablet Take 2 tablets (1,000 mg total) by mouth at bedtime. Patient not taking: Reported on 07/10/2020 11/12/17 01/17/18  [name], MD  metFORMIN (GLUCOPHAGE-XR) 500 MG 24 hr tablet Take 2 tablets (1,000 mg total) by mouth 2 (two) times daily. Patient not taking: Reported on 07/10/2020 01/19/18   [name], MD  neomycin-bacitracin-polymyxin (NEOSPORIN) ointment Apply 1 application topically as needed for wound care.    [provider]  Allergies    Patient has no known allergies.  Review of Systems   Review of Systems  Unable to perform ROS: Dementia  Respiratory: Negative for shortness of breath.   Cardiovascular: Negative for chest pain.  Neurological: Negative for headaches.    Physical Exam Updated Vital Signs BP (!) 166/100   Pulse 97   Temp 98.4 F (36.9 C) (Oral)   Resp 18   Ht 5\' 7"  (1.702 m)   Wt 68.5 kg   SpO2 100%   BMI 23.65 kg/m   Physical Exam Vitals and nursing note reviewed.  Constitutional:      Appearance: He  is well-developed and well-nourished.  HENT:     Head: Normocephalic.     Comments: Healing wound to posterior scalp    Mouth/Throat:     Mouth: Mucous membranes are moist.  Eyes:     Pupils: Pupils are equal, round, and reactive to light.  Cardiovascular:     Rate and Rhythm: Normal rate.  Pulmonary:     Effort: Pulmonary effort is normal. No respiratory distress.  Abdominal:     General: There is no distension.  Musculoskeletal:        General: Normal range of motion.     Cervical back: Normal range of motion.     Comments: No cervical spine tenderness, thoracic spine tenderness or Lumbar spine tenderness.  No tenderness or pain with palpation and full ROM of all joints in upper and lower extremities.  No ecchymosis or other signs of trauma on back or extremities.  No Pain with AP or lateral compression of ribs.  No Paracervical ttp, paraspinal ttp   Skin:    General: Skin is warm and dry.     Comments: Multiple abrasions on elbows, bruises on arms and legs, nothing tender  Neurological:     General: No focal deficit present.     Mental Status: He is alert. He is disoriented.     ED Results / Procedures / Treatments   Labs (all labs ordered are listed, but only abnormal results are displayed) Labs Reviewed - No data to display  EKG None  Radiology CT Head Wo Contrast  Result Date: 07/12/2020 CLINICAL DATA:  Unwitnessed fall EXAM: CT HEAD WITHOUT CONTRAST TECHNIQUE: Contiguous axial images were obtained from the base of the skull through the vertex without intravenous contrast. COMPARISON:  CT 07/10/2020 FINDINGS: Brain: No acute territorial infarction or intracranial mass is visualized. Moderate atrophy. Fairly extensive hypodensity in the white matter consistent with chronic small vessel ischemic change. Encephalomalacia within the right parietal lobe. 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 Redemonstrated hypodense left hemispheric hypodense  subdural hematoma, measuring up to 9 mm maximum thickness over the left frontal lobe. Punctate foci of increased density along the left parasagittal frontal lobe probably represent small foci of acute to subacute blood, this is not significantly changed or progressing. Similar 2 mm midline shift to the right. Stable ventricle size. Vascular: No hyperdense vessels.  Carotid vascular calcification Skull: Normal. Negative for fracture or focal lesion. Sinuses/Orbits: Mucosal thickening in the sinuses Other: Posterior scalp hematoma is slightly decreased. IMPRESSION: 1. No significant interval change in size or appearance of left hemispheric hypodense subdural hematoma, measuring up to 9 mm in maximum thickness over the left frontal lobe, previously 1 cm. Similar 2 mm midline shift to the right. Punctate foci of increased density along the left parasagittal frontal lobe probably represent small foci of more acute blood, but this is also  not significantly changed compared to CT performed 2 days prior. There is no new hemorrhage. 2. Atrophy and chronic small vessel ischemic changes of the white matter. 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 Electronically Signed   By: Donavan Foil M.D.   On: 07/12/2020 23:37   CT Cervical Spine Wo Contrast  Result Date: 07/12/2020 CLINICAL DATA:  Unwitnessed fall EXAM: CT CERVICAL SPINE WITHOUT CONTRAST TECHNIQUE: Multidetector CT imaging of the cervical spine was performed without intravenous contrast. Multiplanar CT image reconstructions were also generated. COMPARISON:  07/10/2020 FINDINGS: Alignment: Stable trace retrolisthesis C4 on C5. Facet alignment is maintained. Skull base and vertebrae: Craniovertebral junction is intact. Vertebral body heights are maintained. Spinous process fractures at T2 and T3. Soft tissues and spinal canal: No prevertebral fluid or swelling. No visible canal hematoma. Disc levels: Multiple level degenerative change with advanced  degenerative changes C4 through C7. Upper chest: Negative. Other: None IMPRESSION: 1. Redemonstrated spinous process fractures at T2 and T3. 2. Advanced degenerative changes of the cervical spine. No acute osseous abnormality. Electronically Signed   By: Donavan Foil M.D.   On: 07/12/2020 23:20    Procedures Procedures (including critical care time)  Medications Ordered in ED Medications - No data to display  ED Course  I have reviewed the triage vital signs and the nursing notes.  Pertinent labs & imaging results that were available during my care of the patient were reviewed by me and considered in my medical decision making (see chart for details).    MDM Rules/Calculators/A&P                          No focal area of pain or obvious new injuries to suggest indication for imaging. Attempted to discuss with family but no answer.   Final Clinical Impression(s) / ED Diagnoses Final diagnoses:  Fall, initial encounter    Rx / DC Orders ED Discharge Orders    None       Ladanian Kelter, Corene Cornea, MD 07/14/20 [phone]

## 2020-07-14 NOTE — ED Notes (Signed)
Called [facility] for patient to transported to [facility].

## 2020-07-19 ENCOUNTER — Other Ambulatory Visit: Payer: Self-pay

## 2020-07-19 ENCOUNTER — Encounter (HOSPITAL_COMMUNITY): Payer: Self-pay

## 2020-07-19 ENCOUNTER — Emergency Department (HOSPITAL_COMMUNITY)
Admission: EM | Admit: 2020-07-19 | Discharge: 2020-07-19 | Disposition: A | Discharge status: Home / Self Care | Payer: Medicare Other | Attending: Emergency Medicine | Admitting: Emergency Medicine

## 2020-07-19 ENCOUNTER — Emergency Department (HOSPITAL_COMMUNITY): Payer: Medicare Other

## 2020-07-19 DIAGNOSIS — Z87891 Personal history of nicotine dependence: Secondary | ICD-10-CM | POA: Insufficient documentation

## 2020-07-19 DIAGNOSIS — I6203 Nontraumatic chronic subdural hemorrhage: Secondary | ICD-10-CM | POA: Diagnosis not present

## 2020-07-19 DIAGNOSIS — W19XXXA Unspecified fall, initial encounter: Secondary | ICD-10-CM | POA: Diagnosis not present

## 2020-07-19 DIAGNOSIS — Z79899 Other long term (current) drug therapy: Secondary | ICD-10-CM | POA: Insufficient documentation

## 2020-07-19 DIAGNOSIS — Z7984 Long term (current) use of oral hypoglycemic drugs: Secondary | ICD-10-CM | POA: Insufficient documentation

## 2020-07-19 DIAGNOSIS — S0003XA Contusion of scalp, initial encounter: Secondary | ICD-10-CM | POA: Insufficient documentation

## 2020-07-19 DIAGNOSIS — F0390 Unspecified dementia without behavioral disturbance: Secondary | ICD-10-CM | POA: Diagnosis not present

## 2020-07-19 DIAGNOSIS — E119 Type 2 diabetes mellitus without complications: Secondary | ICD-10-CM | POA: Insufficient documentation

## 2020-07-19 DIAGNOSIS — Y92129 Unspecified place in nursing home as the place of occurrence of the external cause: Secondary | ICD-10-CM | POA: Diagnosis not present

## 2020-07-19 DIAGNOSIS — Z23 Encounter for immunization: Secondary | ICD-10-CM | POA: Insufficient documentation

## 2020-07-19 DIAGNOSIS — S0990XA Unspecified injury of head, initial encounter: Secondary | ICD-10-CM | POA: Diagnosis present

## 2020-07-19 DIAGNOSIS — I10 Essential (primary) hypertension: Secondary | ICD-10-CM | POA: Diagnosis not present

## 2020-07-19 HISTORY — DX: Unspecified dementia, unspecified severity, without behavioral disturbance, psychotic disturbance, mood disturbance, and anxiety: F03.90

## 2020-07-19 MED ORDER — TETANUS-DIPHTH-ACELL PERTUSSIS 5-2.5-18.5 LF-MCG/0.5 IM SUSY
0.5000 mL | PREFILLED_SYRINGE | Freq: Once | INTRAMUSCULAR | Status: AC
  Administered 2020-07-19: 0.5 mL via INTRAMUSCULAR
  Filled 2020-07-19: qty 0.5

## 2020-07-19 NOTE — Progress Notes (Signed)
Patient came from [facility] facility.

## 2020-07-19 NOTE — ED Provider Notes (Signed)
[facility] DEPT Provider Note: Dennis Spurling, MD, FACEP  CSN: [id number] MRN: [id number] ARRIVAL: 07/19/20 at [facility]: [facility]   CHIEF COMPLAINT  Fall   HISTORY OF PRESENT ILLNESS  07/19/20 5:37 AM Dennis Allen is a 80 y.o. male nursing home resident with dementia.  He has a history of frequent falls and has skin tears from recent falls.  He was sent from his nursing home this morning after being found on the ground after a presumed unwitnessed fall.  Although the patient is demented he is able to deny any pain.  He is awake and alert and to his baseline mentally.  He has multiple superficial wounds of his scalp and skin tears of his arms at various stages of healing.   Past Medical History:  Diagnosis Date  . Dementia ([place])   . Diabetes mellitus without complication ([place])   . Hard of hearing   . Hypertension     Past Surgical History:  Procedure Laterality Date  . ABDOMINAL SURGERY      Family History  Family history unknown: Yes    Social History   Tobacco Use  . Smoking status: Former Research scientist (life sciences)  . Smokeless tobacco: Never Used  Substance Use Topics  . Alcohol use: Yes    Comment: occ  . Drug use: No    Prior to Admission medications   Medication Sig Start Date End Date Taking? Authorizing Provider  acetaminophen (TYLENOL) 500 MG tablet Take 500 mg by mouth every 6 (six) hours as needed for mild pain, fever or headache.    [provider]  alum & mag hydroxide-simeth (MAALOX PLUS) 400-400-40 MG/5ML suspension Take 15 mLs by mouth every 6 (six) hours as needed for indigestion (heartburn).    [provider]  Cholecalciferol 1.25 MG (50000 UT) capsule Take 50,000 Units by mouth once a week. 10/20/19   [provider]  guaiFENesin (ROBITUSSIN) 100 MG/5ML SOLN Take 10 mLs by mouth every 4 (four) hours as needed for cough or to loosen phlegm.    [provider]  levothyroxine (SYNTHROID) 100 MCG tablet Take 100 mcg by mouth  daily. 06/27/20   [provider]  loperamide (IMODIUM) 2 MG capsule Take 2 mg by mouth as needed for diarrhea or loose stools.    [provider]  LORazepam (ATIVAN) 0.5 MG tablet Take 0.5 mg by mouth 2 (two) times daily as needed for anxiety. 07/06/20   [provider]  magnesium hydroxide (MILK OF MAGNESIA) 400 MG/5ML suspension Take 30 mLs by mouth at bedtime as needed for mild constipation.    [provider]  metFORMIN (GLUCOPHAGE) 500 MG tablet Take 500 mg by mouth 2 (two) times daily. 06/27/20   [provider]  neomycin-bacitracin-polymyxin (NEOSPORIN) ointment Apply 1 application topically as needed for wound care.    [provider]  amLODipine-olmesartan (AZOR) 10-40 MG tablet Take 1 tablet by mouth daily. Patient not taking: Reported on 07/10/2020 11/12/17 07/19/20  [name], MD  glipiZIDE (GLUCOTROL) 5 MG tablet Take 1 tablet (5 mg total) by mouth daily before breakfast. Patient not taking: Reported on 07/10/2020 01/19/18 07/19/20  [name], MD    Allergies Patient has no known allergies.   REVIEW OF SYSTEMS  Not assessed due to dementia   PHYSICAL EXAMINATION  Initial Vital Signs Blood pressure (!) 142/77, pulse 89, temperature 97.6 F (36.4 C), temperature source Oral, resp. rate 18.  Examination General: Well-developed, well-nourished male in no acute distress; appearance consistent with  age of record HENT: normocephalic; abrasions and contusions of varying stages of healing:    Eyes: pupils equal, round and reactive to light; extraocular muscles grossly intact Neck: supple; nontender Heart: regular rate and rhythm Lungs: clear to auscultation bilaterally Abdomen: soft; nondistended; nontender; bowel sounds present Extremities: No deformity; no pain on passive range of motion; pulses normal Neurologic: Awake, alert; nonverbal; noted to move all extremities Skin: Warm and dry; subacute skin tears and  ecchymoses primarily of left upper extremity:     Psychiatric: Not affect   RESULTS  Summary of this visit's results, reviewed and interpreted by myself:   EKG Interpretation  Date/Time:    Ventricular Rate:    PR Interval:    QRS Duration:   QT Interval:    QTC Calculation:   R Axis:     Text Interpretation:        Laboratory Studies: No results found for this or any previous visit (from the past 24 hour(s)). Imaging Studies: CT Head Wo Contrast  Result Date: 07/19/2020 CLINICAL DATA:  80 year old male status post unwitnessed fall. Left subdural hematoma since 07/10/2020. EXAM: CT HEAD WITHOUT CONTRAST TECHNIQUE: Contiguous axial images were obtained from the base of the skull through the vertex without intravenous contrast. COMPARISON:  Head CT 07/12/2020. FINDINGS: Brain: Hypodense left subdural hematoma has regressed with small residual measuring 3 mm in thickness on coronal image 46 (compare to previous series 6, image 47). A more chronic appearing left anterior frontal convexity component measuring 6 mm in thickness with mild dural thickening has also slightly regressed on series 2, image 20. Trace hyperdense blood products nearby at the junction of the falx are stable. No new extra-axial blood identified. No midline shift. Mild mass effect on the left lateral ventricle is stable. Normal basilar cisterns. Chronic encephalomalacia in the posterior right MCA and right PCA territories is stable. Stable dystrophic calcification dorsal right pons. Patchy and confluent white matter hypodensity elsewhere is stable. Relative sparing of the deep gray nuclei as before. No cortically based acute infarct identified. Vascular: Calcified atherosclerosis at the skull base. No suspicious intracranial vascular hyperdensity. Skull: Stable. No acute osseous abnormality identified. Chronic nasal bone fractures. Sinuses/Orbits: Visualized paranasal sinuses and mastoids are stable and well pneumatized.  Other: Broad-based left posterior vertex scalp hematoma has not significantly changed. No new orbit or scalp soft tissue injury identified. IMPRESSION: 1. Partial regression of mixed density left subdural hematoma since 07/10/2018. Small residual. Stable minor left hemisphere mass effect with no midline shift now. 2. No new intracranial abnormality. Stable chronic ischemic disease. 3. Stable scalp hematoma.  No skull fracture identified. Electronically Signed   By: Genevie Ann M.D.   On: 07/19/2020 06:19    ED COURSE and MDM  Nursing notes, initial and subsequent vitals signs, including pulse oximetry, reviewed and interpreted by myself.  Vitals:   07/19/20 0543  BP: (!) 142/77  Pulse: 89  Resp: 18  Temp: 97.6 F (36.4 C)  TempSrc: Oral   Medications  Tdap (BOOSTRIX) injection 0.5 mL (0.5 mLs Intramuscular Given 07/19/20 0548)   6:25 AM No evidence of acute intracranial injury.  Subdural hematoma is resolving.  Admission or further work-up is not indicated at this time.  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 Fall at nursing home, initial encounter  W19.XXXA    Y92.129   3. Scalp hematoma, initial encounter  S00.03XA  Andilyn Bettcher, Jenny Reichmann, MD 07/19/20 [phone]

## 2020-07-19 NOTE — ED Triage Notes (Signed)
Patient arrived unwitnessed fall. Declines any complaints at this time. No LOC. Patient states he did not want to come but facility wanting him medically cleared.

## 2020-07-19 NOTE — ED Notes (Signed)
[facility] called for transport. [facility] called and aware of patients return to facility.

## 2020-07-19 NOTE — ED Notes (Signed)
[facility] notified for pt transfer to [facility].  

## 2020-09-23 DEATH — deceased

## 2021-01-31 ENCOUNTER — Encounter: Payer: Self-pay | Admitting: Gastroenterology

## 2022-02-23 IMAGING — CR DG HIP (WITH OR WITHOUT PELVIS) 3-4V BILAT
4 series · 4 of 4 positions shown · non-contrast
Comparison: None.

CLINICAL DATA: Unwitnessed fall last night and this morning.

EXAM:
DG HIP (WITH OR WITHOUT PELVIS) 3-4V BILAT

[x pelvis]
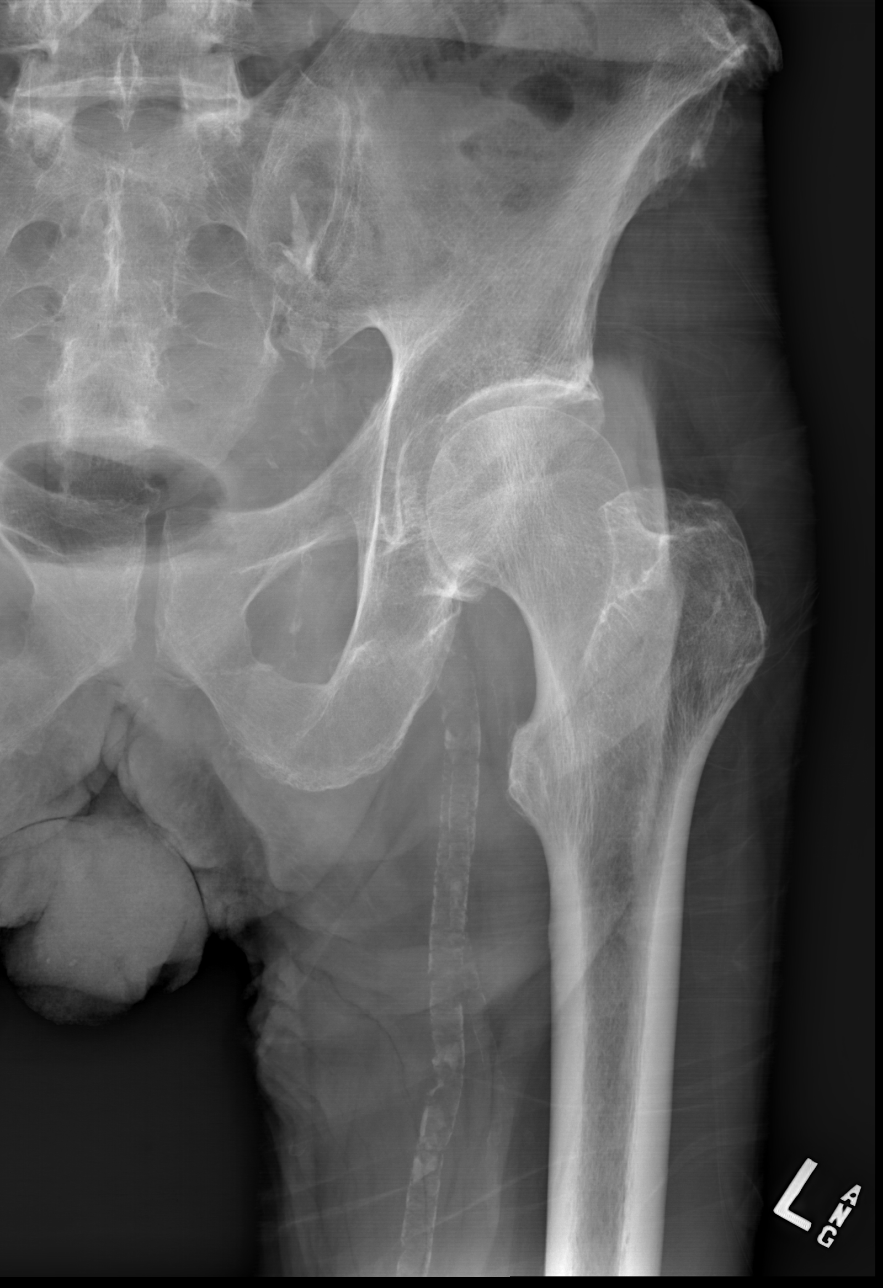

[x hip ap left]
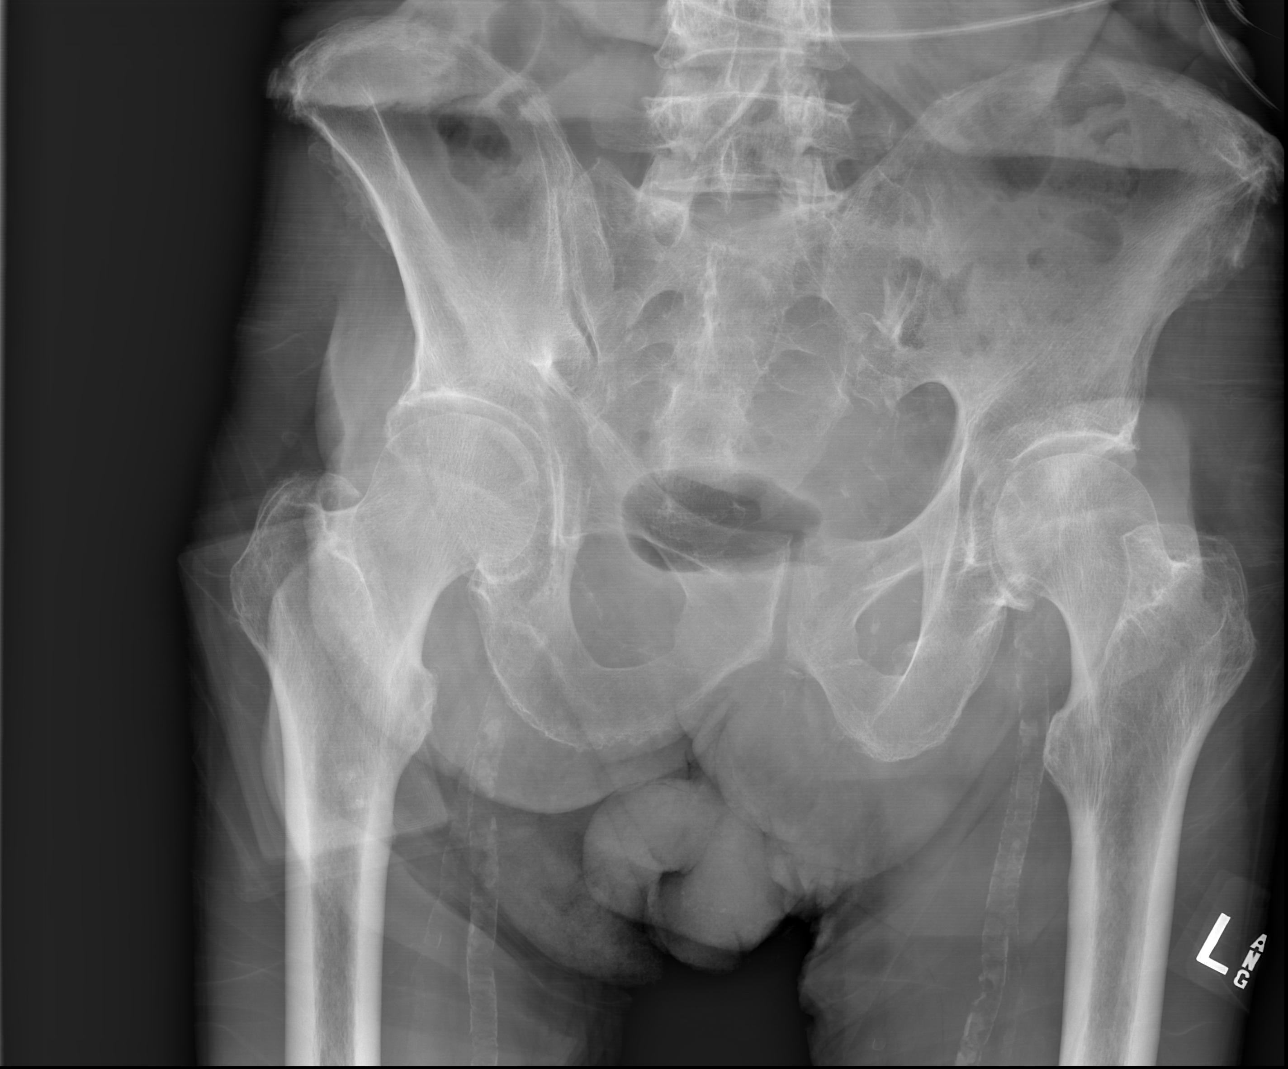

[x hip lat left]
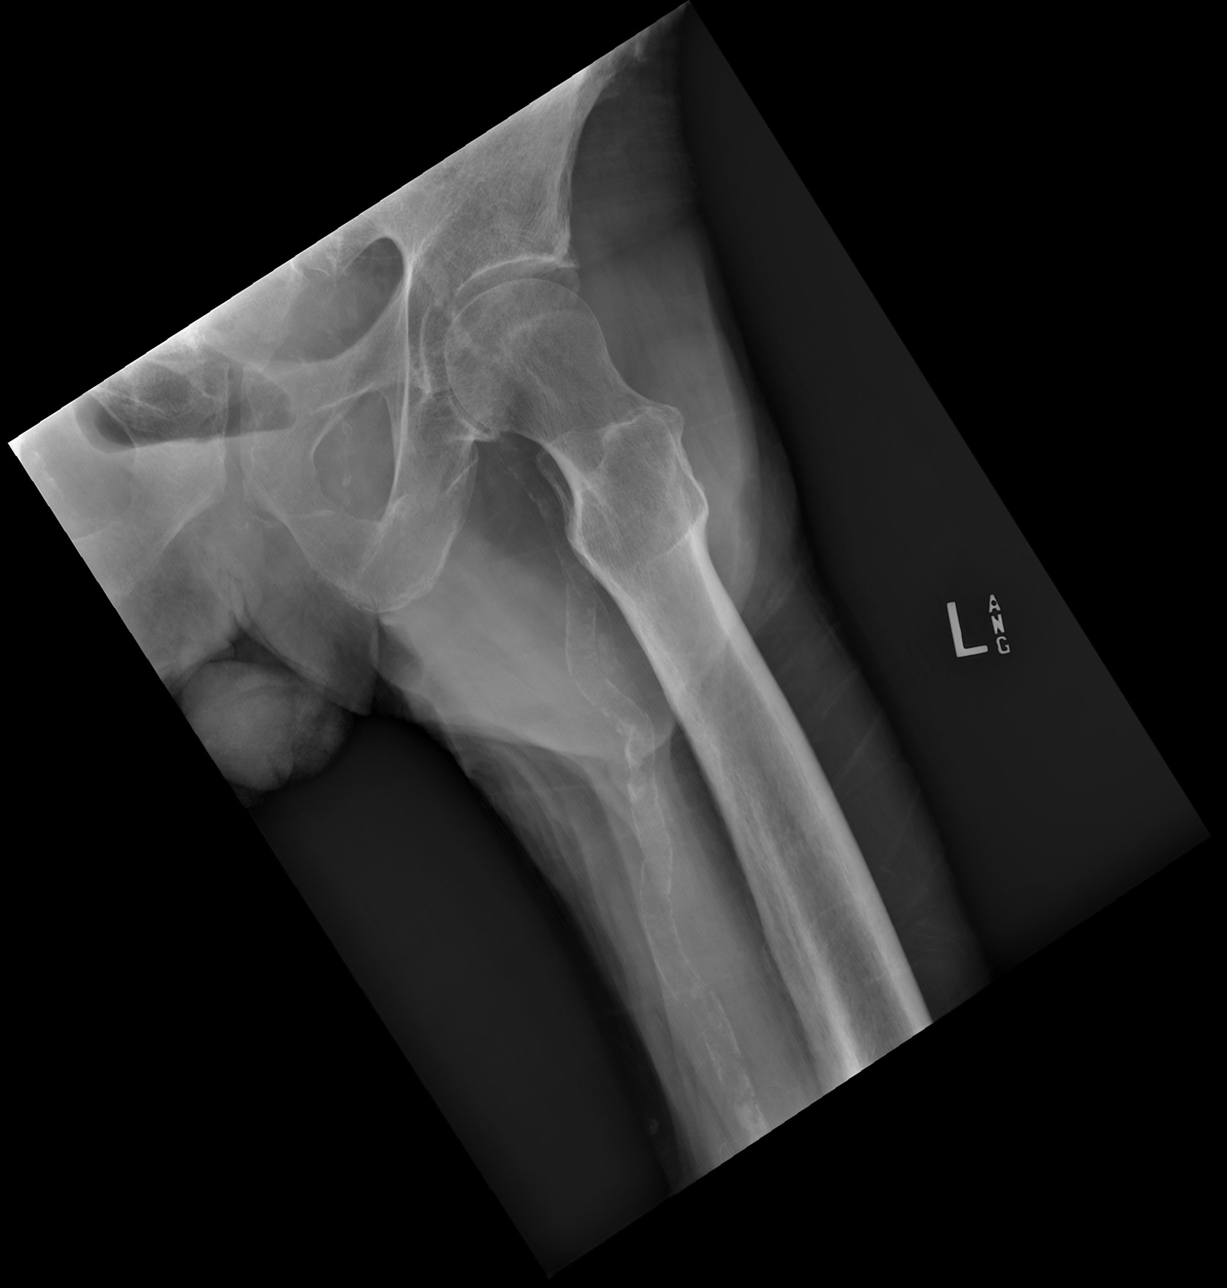

[x hip lat right]
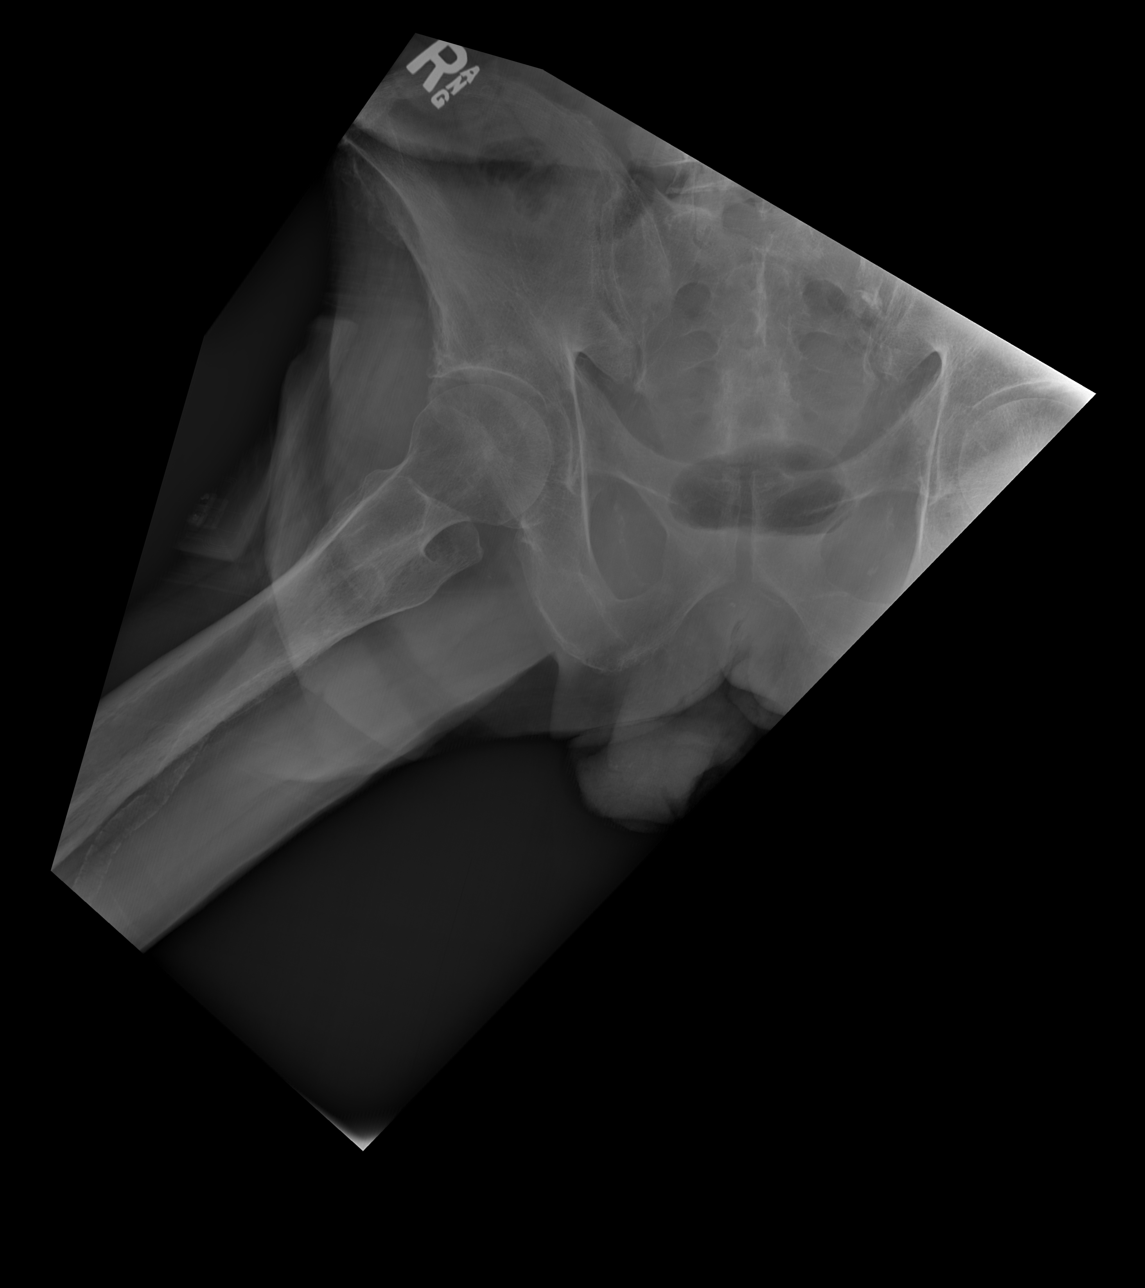

[4 of 4 positions shown; findings below may reference images not displayed]

FINDINGS: Mild symmetric degenerative change of the hips. No evidence of acute
fracture or dislocation. Mild degenerate change of the spine.
Calcified plaque over the femoral arteries.
IMPRESSION: 1. No acute findings.
2. Mild symmetric degenerative change of the hips.

## 2022-02-25 IMAGING — CT CT CERVICAL SPINE W/O CM
3 of 4 series · 13 of 33 positions shown, 16 images · non-contrast
Comparison: 07/10/2020

CLINICAL DATA: Unwitnessed fall

EXAM:
CT CERVICAL SPINE WITHOUT CONTRAST
TECHNIQUE: Multidetector CT imaging of the cervical spine was performed without
intravenous contrast. Multiplanar CT image reconstructions were also
generated.

[Series 6: orthogonal bone · axial · 0.23mm/px · z∈[-264,-147]mm · 5 of 86 slices shown, 7 images]
[im 13/86  soft-tissue]
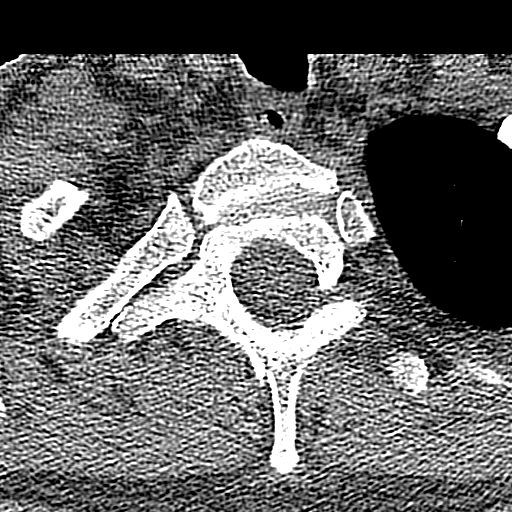
[im 13/86  bone]
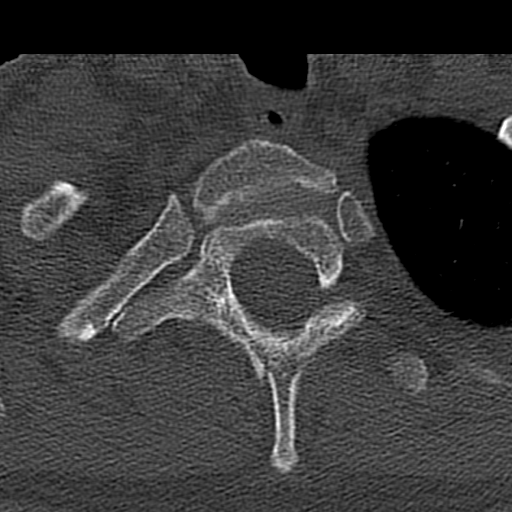
[im 25/86  bone]
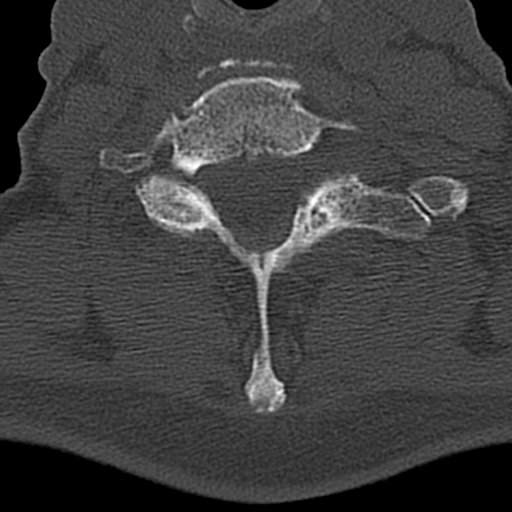
[im 49/86  bone]
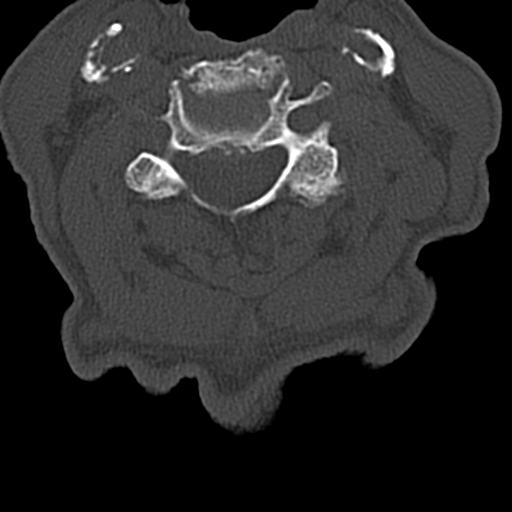
[im 61/86  bone]
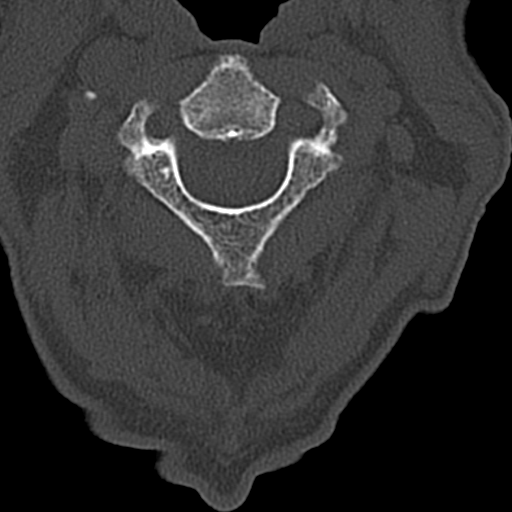
[im 73/86  soft-tissue]
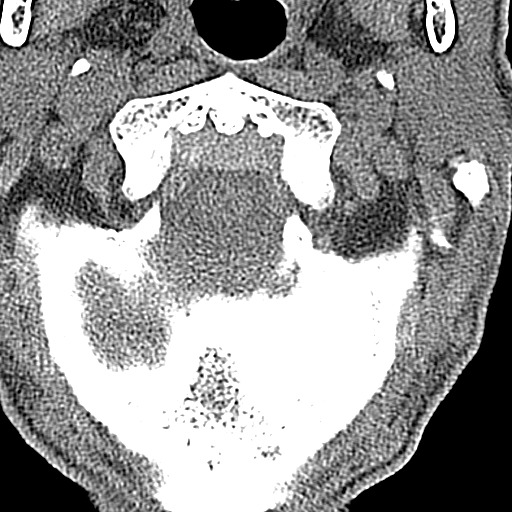
[im 73/86  bone]
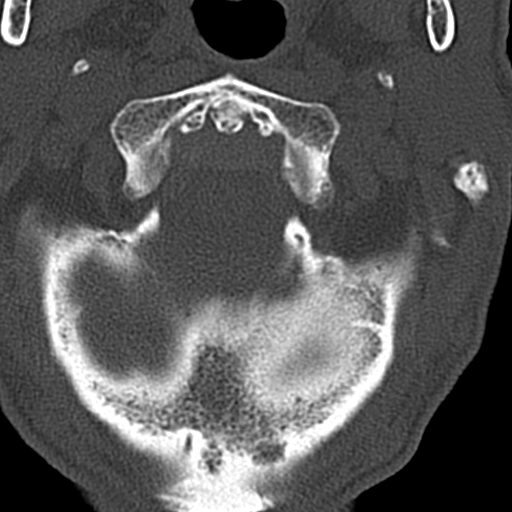

[Series 7: coronal bone · coronal · 0.23mm/px · 3 of 61 slices shown]
[im 13/61  bone]
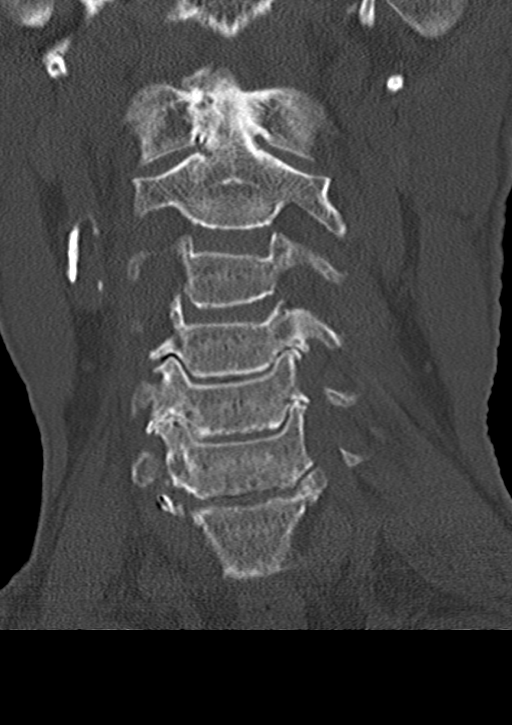
[im 25/61  bone]
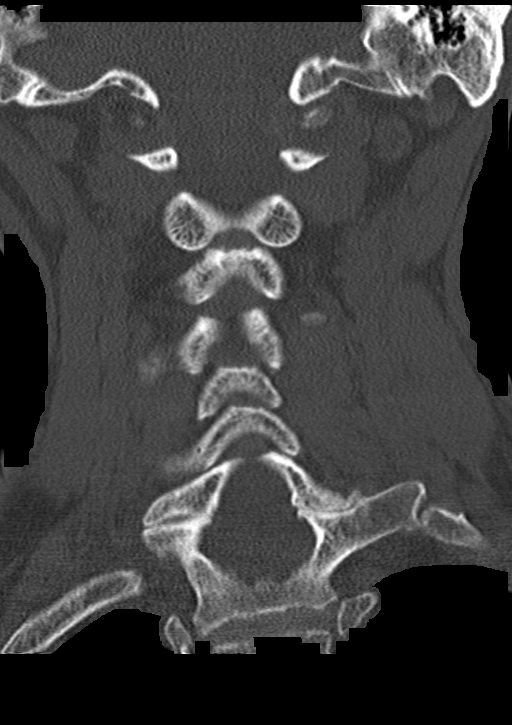
[im 37/61  bone]
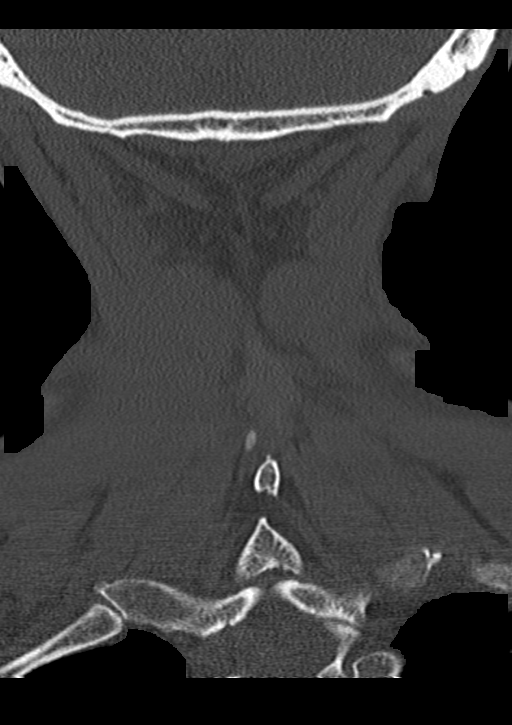

[Series 8: sagittal bone · sagittal · 0.23mm/px · 5 of 61 slices shown, 6 images]
[im 21/61  bone]
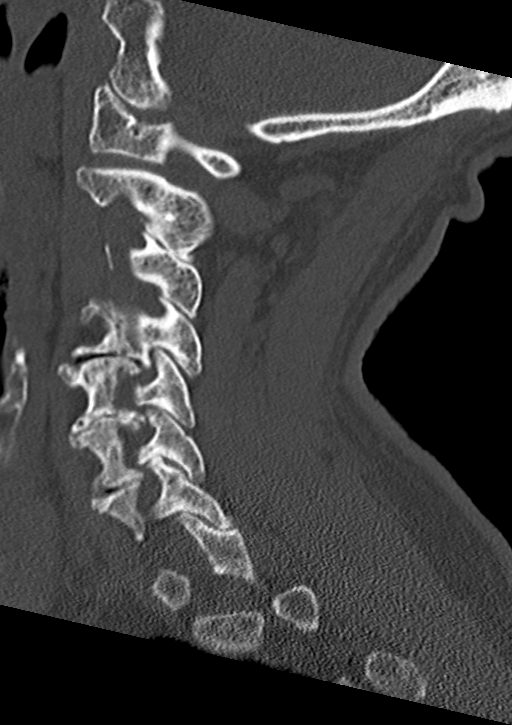
[im 26/61  bone]
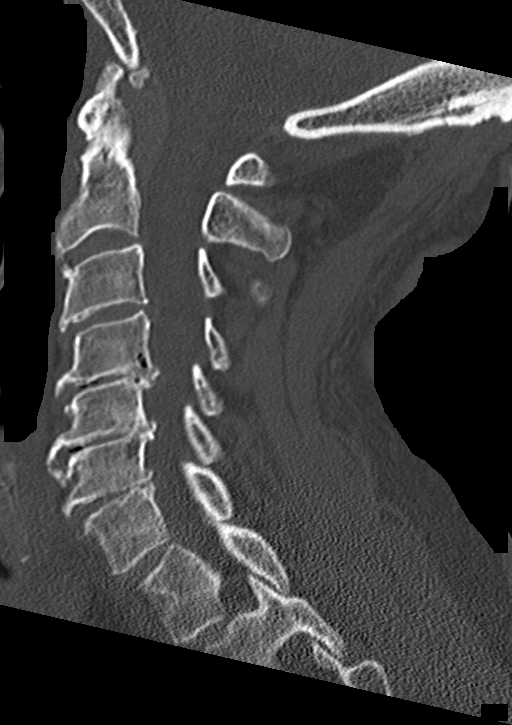
[im 31/61  soft-tissue]
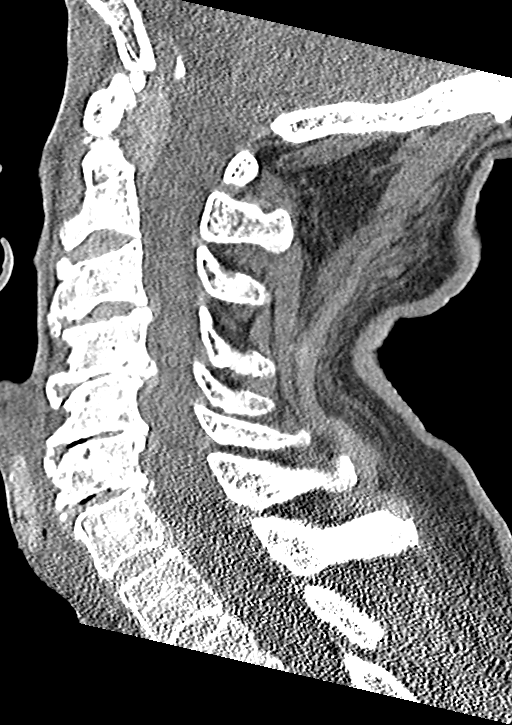
[im 31/61  bone]
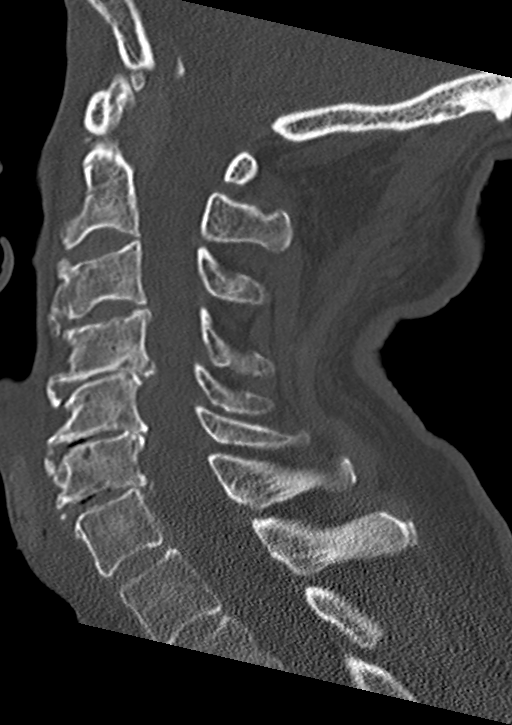
[im 36/61  bone]
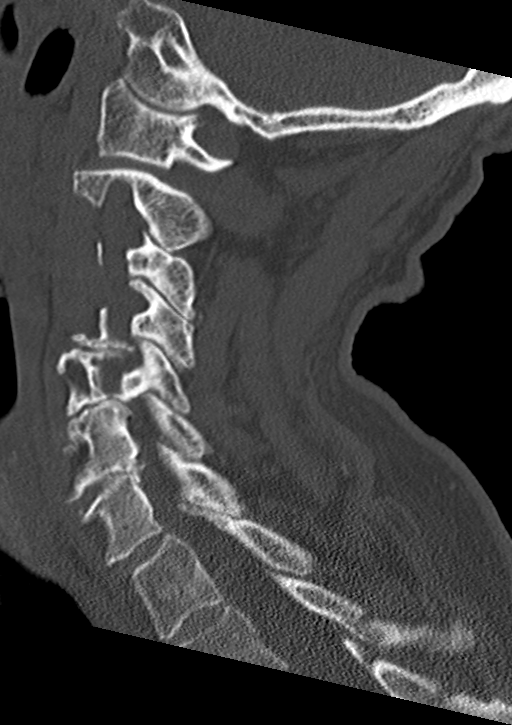
[im 41/61  bone]
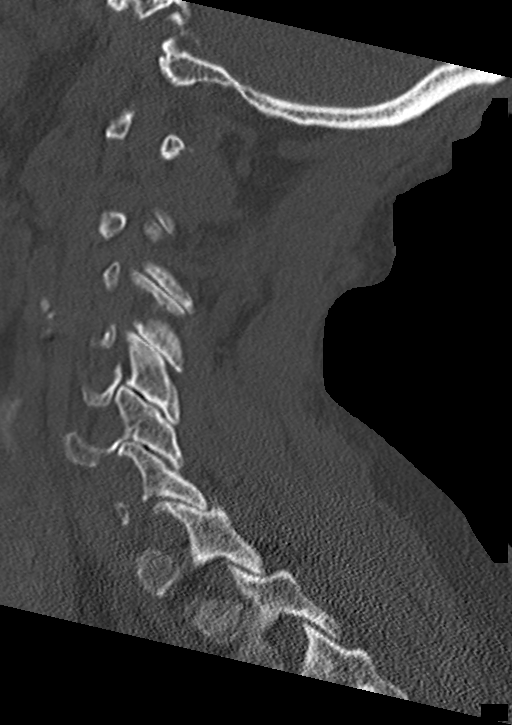

[13 of 33 positions shown; findings below may reference images not displayed]

FINDINGS: Alignment: Stable trace retrolisthesis C4 on C5. Facet alignment is
maintained.

Skull base and vertebrae: Craniovertebral junction is intact.
Vertebral body heights are maintained. Spinous process fractures at
T2 and T3.

Soft tissues and spinal canal: No prevertebral fluid or swelling. No
visible canal hematoma.

Disc levels: Multiple level degenerative change with advanced
degenerative changes C4 through C7.

Upper chest: Negative.

Other: None
IMPRESSION: 1. Redemonstrated spinous process fractures at T2 and T3.
2. Advanced degenerative changes of the cervical spine. No acute
osseous abnormality.
